# Patient Record
Sex: Female | Born: 1950 | Race: White | Hispanic: No | Smoking: Never smoker
Health system: Southern US, Community
[De-identification: ages and names within clinical notes are randomized; demographics above are authoritative.]

## PROBLEM LIST (undated history)

## (undated) DIAGNOSIS — D649 Anemia, unspecified: Secondary | ICD-10-CM

## (undated) DIAGNOSIS — Z9109 Other allergy status, other than to drugs and biological substances: Secondary | ICD-10-CM

## (undated) DIAGNOSIS — K219 Gastro-esophageal reflux disease without esophagitis: Secondary | ICD-10-CM

## (undated) DIAGNOSIS — I10 Essential (primary) hypertension: Secondary | ICD-10-CM

## (undated) DIAGNOSIS — N6320 Unspecified lump in the left breast, unspecified quadrant: Secondary | ICD-10-CM

## (undated) DIAGNOSIS — F419 Anxiety disorder, unspecified: Secondary | ICD-10-CM

## (undated) DIAGNOSIS — M1990 Unspecified osteoarthritis, unspecified site: Secondary | ICD-10-CM

## (undated) DIAGNOSIS — J45909 Unspecified asthma, uncomplicated: Secondary | ICD-10-CM

## (undated) DIAGNOSIS — E039 Hypothyroidism, unspecified: Secondary | ICD-10-CM

## (undated) HISTORY — PX: COLONOSCOPY W/ POLYPECTOMY: SHX1380

## (undated) HISTORY — PX: SHOULDER ARTHROSCOPY: SHX128

## (undated) HISTORY — PX: LAPAROSCOPY: SHX197

## (undated) HISTORY — PX: VAGINA RECONSTRUCTION SURGERY: SHX828

## (undated) HISTORY — PX: ABDOMINAL HYSTERECTOMY: SHX81

---

## 1998-06-24 ENCOUNTER — Other Ambulatory Visit: Admission: RE | Admit: 1998-06-24 | Discharge: 1998-06-24 | Payer: Self-pay | Admitting: Obstetrics & Gynecology

## 1998-07-17 ENCOUNTER — Other Ambulatory Visit: Admission: RE | Admit: 1998-07-17 | Discharge: 1998-07-17 | Payer: Self-pay | Admitting: Obstetrics & Gynecology

## 1998-10-07 ENCOUNTER — Other Ambulatory Visit: Admission: RE | Admit: 1998-10-07 | Discharge: 1998-10-07 | Payer: Self-pay | Admitting: Obstetrics & Gynecology

## 1999-03-31 ENCOUNTER — Other Ambulatory Visit: Admission: RE | Admit: 1999-03-31 | Discharge: 1999-03-31 | Payer: Self-pay | Admitting: Obstetrics & Gynecology

## 2000-02-03 ENCOUNTER — Other Ambulatory Visit: Admission: RE | Admit: 2000-02-03 | Discharge: 2000-02-03 | Payer: Self-pay | Admitting: Obstetrics & Gynecology

## 2001-04-11 ENCOUNTER — Other Ambulatory Visit: Admission: RE | Admit: 2001-04-11 | Discharge: 2001-04-11 | Payer: Self-pay | Admitting: Obstetrics & Gynecology

## 2002-05-21 ENCOUNTER — Other Ambulatory Visit: Admission: RE | Admit: 2002-05-21 | Discharge: 2002-05-21 | Payer: Self-pay | Admitting: Obstetrics & Gynecology

## 2002-09-20 ENCOUNTER — Emergency Department (HOSPITAL_COMMUNITY): Admission: EM | Admit: 2002-09-20 | Discharge: 2002-09-20 | Payer: Self-pay | Admitting: Emergency Medicine

## 2003-05-27 ENCOUNTER — Other Ambulatory Visit: Admission: RE | Admit: 2003-05-27 | Discharge: 2003-05-27 | Payer: Self-pay | Admitting: Obstetrics & Gynecology

## 2003-06-25 ENCOUNTER — Ambulatory Visit (HOSPITAL_COMMUNITY): Admission: RE | Admit: 2003-06-25 | Discharge: 2003-06-25 | Payer: Self-pay | Admitting: Obstetrics & Gynecology

## 2004-06-15 ENCOUNTER — Emergency Department (HOSPITAL_COMMUNITY): Admission: EM | Admit: 2004-06-15 | Discharge: 2004-06-15 | Payer: Self-pay | Admitting: Emergency Medicine

## 2004-07-03 ENCOUNTER — Other Ambulatory Visit: Admission: RE | Admit: 2004-07-03 | Discharge: 2004-07-03 | Payer: Self-pay | Admitting: Obstetrics & Gynecology

## 2005-09-01 ENCOUNTER — Inpatient Hospital Stay (HOSPITAL_COMMUNITY): Admission: RE | Admit: 2005-09-01 | Discharge: 2005-09-03 | Payer: Self-pay | Admitting: Specialist

## 2012-03-25 ENCOUNTER — Encounter (HOSPITAL_BASED_OUTPATIENT_CLINIC_OR_DEPARTMENT_OTHER): Payer: Self-pay

## 2012-03-25 ENCOUNTER — Emergency Department (HOSPITAL_BASED_OUTPATIENT_CLINIC_OR_DEPARTMENT_OTHER)
Admission: EM | Admit: 2012-03-25 | Discharge: 2012-03-25 | Disposition: A | Discharge status: Home / Self Care | Payer: 59 | Attending: Emergency Medicine | Admitting: Emergency Medicine

## 2012-03-25 DIAGNOSIS — X58XXXA Exposure to other specified factors, initial encounter: Secondary | ICD-10-CM | POA: Insufficient documentation

## 2012-03-25 DIAGNOSIS — S43016A Anterior dislocation of unspecified humerus, initial encounter: Secondary | ICD-10-CM | POA: Insufficient documentation

## 2012-03-25 DIAGNOSIS — Y929 Unspecified place or not applicable: Secondary | ICD-10-CM | POA: Insufficient documentation

## 2012-03-25 DIAGNOSIS — Y939 Activity, unspecified: Secondary | ICD-10-CM | POA: Insufficient documentation

## 2012-03-25 DIAGNOSIS — S43002A Unspecified subluxation of left shoulder joint, initial encounter: Secondary | ICD-10-CM

## 2012-03-25 MED ORDER — HYDROCODONE-ACETAMINOPHEN 5-325 MG PO TABS: 1.0000 | ORAL_TABLET | Freq: Four times a day (QID) | ORAL | Status: DC | PRN

## 2012-03-25 NOTE — ED Provider Notes (Signed)
History     CSN: [id number]  Arrival date & time 03/25/12  6433   First MD Initiated Contact with Patient 03/25/12 [phone]      Chief Complaint  Patient presents with  . Shoulder Pain    (Consider location/radiation/quality/duration/timing/severity/associated sxs/prior treatment) HPI This is a 61 year old female who's had multiple dislocations and subluxations of the left shoulder. She felt it sublux yesterday evening about 11 PM. She is not sure exactly what she did do this. She's been trying overnight to reduce it herself but has not been able to do so. There is only mild to moderate pain associated with it, worse with movement. There is no motor or sensory deficit.  No past medical history on file.  No past surgical history on file.  No family history on file.  History  Substance Use Topics  . Smoking status: Not on file  . Smokeless tobacco: Not on file  . Alcohol Use: Not on file    OB History    No data available      Review of Systems  All other systems reviewed and are negative.    Allergies  Review of patient's allergies indicates not on file.  Home Medications  No current outpatient prescriptions on file.  There were no vitals taken for this visit.  Physical Exam General: Well-developed, well-nourished female in no acute distress; appearance consistent with age of record HENT: normocephalic, atraumatic Eyes: pupils equal round and reactive to light; extraocular muscles intact Neck: supple Heart: regular rate and rhythm Lungs: clear to auscultation bilaterally Abdomen: soft; nondistended; nontender; bowel sounds present Extremities: Mild anterior subluxation of left humeral head with mild tenderness; left upper extremity distally neurovascularly intact Neurologic: Awake, alert and oriented; motor function intact in all extremities and symmetric; no facial droop Skin: Warm and dry Psychiatric: Normal mood and affect    ED Course  Procedures  (including critical care time)  CLOSED REDUCTION The patient's left shoulder subluxation was reduced with hyperextension. There was a return to the patient's subjective normal alignment with full range of motion on objective examination. There were no complications the patient tolerated this well.   MDM  Patient declines x-ray evaluation. She requests a sling.        Hanley Seamen, MD 03/25/12 [phone]

## 2012-03-25 NOTE — ED Notes (Signed)
Shoulder out of sock since 11 am

## 2014-09-30 ENCOUNTER — Other Ambulatory Visit: Payer: Self-pay | Admitting: Obstetrics & Gynecology

## 2014-10-01 LAB — CYTOLOGY - PAP

## 2015-07-22 ENCOUNTER — Other Ambulatory Visit: Payer: Self-pay | Admitting: Obstetrics and Gynecology

## 2015-07-22 DIAGNOSIS — N6320 Unspecified lump in the left breast, unspecified quadrant: Secondary | ICD-10-CM

## 2015-07-24 ENCOUNTER — Ambulatory Visit
Admission: RE | Admit: 2015-07-24 | Discharge: 2015-07-24 | Disposition: A | Discharge status: Home / Self Care | Payer: 59 | Source: Ambulatory Visit | Attending: Obstetrics and Gynecology | Admitting: Obstetrics and Gynecology

## 2015-07-24 DIAGNOSIS — N6320 Unspecified lump in the left breast, unspecified quadrant: Secondary | ICD-10-CM

## 2015-09-09 ENCOUNTER — Other Ambulatory Visit: Payer: Self-pay | Admitting: Surgery

## 2015-09-16 ENCOUNTER — Encounter (HOSPITAL_BASED_OUTPATIENT_CLINIC_OR_DEPARTMENT_OTHER): Payer: Self-pay | Admitting: *Deleted

## 2015-09-18 ENCOUNTER — Encounter (HOSPITAL_BASED_OUTPATIENT_CLINIC_OR_DEPARTMENT_OTHER)
Admission: RE | Admit: 2015-09-18 | Discharge: 2015-09-18 | Disposition: A | Discharge status: Home / Self Care | Payer: 59 | Source: Ambulatory Visit | Attending: Surgery | Admitting: Surgery

## 2015-09-18 ENCOUNTER — Other Ambulatory Visit: Payer: Self-pay

## 2015-09-18 DIAGNOSIS — Z01812 Encounter for preprocedural laboratory examination: Secondary | ICD-10-CM | POA: Diagnosis present

## 2015-09-18 DIAGNOSIS — I10 Essential (primary) hypertension: Secondary | ICD-10-CM | POA: Insufficient documentation

## 2015-09-18 DIAGNOSIS — Z01810 Encounter for preprocedural cardiovascular examination: Secondary | ICD-10-CM | POA: Insufficient documentation

## 2015-09-18 LAB — BASIC METABOLIC PANEL
Anion gap: 8 (ref 5–15)
BUN: 18 mg/dL (ref 6–20)
CALCIUM: 9.6 mg/dL (ref 8.9–10.3)
CO2: 28 mmol/L (ref 22–32)
CREATININE: 0.92 mg/dL (ref 0.44–1.00)
Chloride: 101 mmol/L (ref 101–111)
GFR calc Af Amer: >60 mL/min (ref >60)
GLUCOSE: 90 mg/dL (ref 65–99)
Potassium: 5 mmol/L (ref 3.5–5.1)
SODIUM: 137 mmol/L (ref 135–145)

## 2015-09-18 NOTE — Progress Notes (Signed)
Dr. Al Corpus reviewed EKG - pt needs to see a cardiologist for clearance for surgery. Called Dr. Ree Kida office, spoke with Abigail Butts RN explained pt needs cardiac clearance before she can have surgery here. Spoke with pt no Ekg to compare too and she has never seen a cardiologist.

## 2015-09-23 ENCOUNTER — Ambulatory Visit (HOSPITAL_BASED_OUTPATIENT_CLINIC_OR_DEPARTMENT_OTHER): Admission: RE | Admit: 2015-09-23 | Payer: 59 | Source: Ambulatory Visit | Admitting: Surgery

## 2015-09-23 HISTORY — DX: Unspecified asthma, uncomplicated: J45.909

## 2015-09-23 HISTORY — DX: Unspecified lump in the left breast, unspecified quadrant: N63.20

## 2015-09-23 HISTORY — DX: Essential (primary) hypertension: I10

## 2015-09-23 HISTORY — DX: Unspecified osteoarthritis, unspecified site: M19.90

## 2015-09-23 HISTORY — DX: Anxiety disorder, unspecified: F41.9

## 2015-09-23 HISTORY — DX: Hypothyroidism, unspecified: E03.9

## 2015-09-23 SURGERY — BREAST LUMPECTOMY
Anesthesia: General | Laterality: Left

## 2015-09-30 ENCOUNTER — Telehealth: Payer: Self-pay | Admitting: Cardiovascular Disease

## 2015-09-30 NOTE — Telephone Encounter (Signed)
Received records from [facility] for appointment on 10/07/15 with Dr Gwenlyn Found.  Records given to [facility] (medical records) for Dr Kennon Holter schedule on 10/07/15. lp

## 2015-10-01 ENCOUNTER — Telehealth: Payer: Self-pay | Admitting: Cardiovascular Disease

## 2015-10-01 ENCOUNTER — Encounter: Payer: Self-pay | Admitting: Cardiovascular Disease

## 2015-10-01 NOTE — Telephone Encounter (Signed)
Closed encounter °

## 2015-10-07 ENCOUNTER — Ambulatory Visit: Payer: 59 | Admitting: Cardiovascular Disease

## 2015-10-16 ENCOUNTER — Ambulatory Visit: Payer: 59 | Admitting: Cardiovascular Disease

## 2015-10-21 ENCOUNTER — Ambulatory Visit (INDEPENDENT_AMBULATORY_CARE_PROVIDER_SITE_OTHER): Payer: Managed Care, Other (non HMO) | Admitting: Cardiovascular Disease

## 2015-10-21 ENCOUNTER — Encounter: Payer: Self-pay | Admitting: Cardiovascular Disease

## 2015-10-21 VITALS — BP 106/80 | HR 82 | Ht 65.0 in | Wt 164.1 lb

## 2015-10-21 DIAGNOSIS — R079 Chest pain, unspecified: Secondary | ICD-10-CM | POA: Diagnosis not present

## 2015-10-21 DIAGNOSIS — R9431 Abnormal electrocardiogram [ECG] [EKG]: Secondary | ICD-10-CM

## 2015-10-21 NOTE — Assessment & Plan Note (Signed)
Leslie Hines relates episodes of nocturnal chest pain that awakens her from sleep with some occasional left upper extremity radiation. Because of this, and her upcoming elective lumpectomy, I'm going to arrange for her to undergo exercise Myoview stress testing to risk stratify her.

## 2015-10-21 NOTE — Assessment & Plan Note (Signed)
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 Based on this, in addition to her exercise Myoview stress test we will get a 2-D echo to assess for regional wall motion abnormalities.

## 2015-10-21 NOTE — Patient Instructions (Signed)
Medication Instructions:  Your physician recommends that you continue on your current medications as directed. Please refer to the Current Medication list given to you today.    Testing/Procedures: Your physician has requested that you have an echocardiogram. Echocardiography is a painless test that uses sound waves to create images of your heart. It provides your doctor with information about the size and shape of your heart and how well your heart's chambers and valves are working. This procedure takes approximately one hour. There are no restrictions for this procedure.  Your physician has requested that you have en exercise stress myoview. For further information please visit [email or web address]. Please follow instruction sheet, as given.   Follow-Up: Your physician recommends that you schedule a follow-up appointment ON AN AS NEEDED BASIS IF TEST ARE NORMAL.   Echocardiogram An echocardiogram, or echocardiography, uses sound waves (ultrasound) to produce an image of your heart. The echocardiogram is simple, painless, obtained within a short period of time, and offers valuable information to your health care provider. The images from an echocardiogram can provide information such as:  Evidence of coronary artery disease (CAD).  Heart size.  Heart muscle function.  Heart valve function.  Aneurysm detection.  Evidence of a past heart attack.  Fluid buildup around the heart.  Heart muscle thickening.  Assess heart valve function. LET [facility] CARE PROVIDER KNOW ABOUT:  Any allergies you have.  All medicines you are taking, including vitamins, herbs, eye drops, creams, and over-the-counter medicines.  Previous problems you or members of your family have had with the use of anesthetics.  Any blood disorders you have.  Previous surgeries you have had.  Medical conditions you have.  Possibility of pregnancy, if this applies. BEFORE THE PROCEDURE  No special  preparation is needed. Eat and drink normally.  PROCEDURE   In order to produce an image of your heart, gel will be applied to your chest and a wand-like tool (transducer) will be moved over your chest. The gel will help transmit the sound waves from the transducer. The sound waves will harmlessly bounce off your heart to allow the heart images to be captured in real-time motion. These images will then be recorded.  You may need an IV to receive a medicine that improves the quality of the pictures. AFTER THE PROCEDURE You may return to your normal schedule including diet, activities, and medicines, unless your health care provider tells you otherwise.   This information is not intended to replace advice given to you by your health care provider. Make sure you discuss any questions you have with your health care provider.   Document Released: 04/09/2000 Document Revised: 05/03/2014 Document Reviewed: 12/18/2012 Elsevier Interactive Patient Education [employer].    if you need a refill on your cardiac medications before your next appointment, please call your pharmacy.

## 2015-10-21 NOTE — Progress Notes (Signed)
10/21/2015 Leslie Hines   Oct 11, 1950  [id number]  Primary Physician Gennette Pac, MD Primary Cardiologist: Lorretta Harp MD Renae Gloss  HPI:  Leslie Hines is a delightful 65 year old mildly overweight divorced Caucasian female mother of 2, grandmother of 2 grandchildren who she referred for preoperative clearance before elective lumpectomy scheduled to be performed by Dr. Rush Farmer. Her primary care physician is Dr. Hulan Fess and her endocrinologist Dr. Chalmers Cater . She works as a Scientific laboratory technician. Her risk factors include family history although at an advanced age. She has never had a heart attack or stroke. She has had an uncomplicated left shoulder repair by Dr. Mardelle Matte. Recent lipid profile performed 01/14/15 revealed a total cholesterol of 227, LDL of 102 and HDL of 93. She does relate episodes of nocturnal chest pain that awakened her from sleep several times a year. There is some associated left upper extremity radiation.   Current Outpatient Prescriptions  Medication Sig Dispense Refill  . albuterol (PROVENTIL HFA;VENTOLIN HFA) 108 (90 Base) MCG/ACT inhaler Inhale into the lungs every 6 (six) hours as needed for wheezing or shortness of breath.    . ALPRAZolam (XANAX) 0.5 MG tablet Take 0.5 mg by mouth at bedtime as needed for anxiety.    . calcium carbonate (OS-CAL - DOSED IN MG OF ELEMENTAL CALCIUM) 1250 (500 Ca) MG tablet Take 1 tablet by mouth.    . estradiol (ESTRACE) 2 MG tablet Take 2 mg by mouth daily.    . Fluticasone-Salmeterol (ADVAIR) 100-50 MCG/DOSE AEPB Inhale 1 puff into the lungs 2 (two) times daily.    . hydrochlorothiazide (HYDRODIURIL) 25 MG tablet Take 25 mg by mouth daily.    Marland Kitchen levothyroxine (SYNTHROID, LEVOTHROID) 75 MCG tablet Take 75 mcg by mouth daily before breakfast.    . magnesium 30 MG tablet Take 30 mg by mouth 2 (two) times daily.    . meloxicam (MOBIC) 15 MG tablet Take 15 mg by mouth daily.    . montelukast (SINGULAIR) 10 MG  tablet Take 10 mg by mouth at bedtime.    Merril Abbe 10 MCG TABS vaginal tablet INSERT 1 VAGINALLY TWICE A WEEK  11  . zolpidem (AMBIEN) 10 MG tablet Take 10 mg by mouth at bedtime as needed for sleep.     No current facility-administered medications for this visit.    Allergies  Allergen Reactions  . Macrobid [Nitrofurantoin Macrocrystal] Nausea Only  . Sulfur     Social History   Social History  . Marital Status: Divorced    Spouse Name: N/A  . Number of Children: N/A  . Years of Education: N/A   Occupational History  . Not on file.   Social History Main Topics  . Smoking status: Never Smoker   . Smokeless tobacco: Not on file  . Alcohol Use: Yes     Comment: social  . Drug Use: No  . Sexual Activity: Not on file   Other Topics Concern  . Not on file   Social History Narrative     Review of Systems: General: negative for chills, fever, night sweats or weight changes.  Cardiovascular: negative for chest pain, dyspnea on exertion, edema, orthopnea, palpitations, paroxysmal nocturnal dyspnea or shortness of breath Dermatological: negative for rash Respiratory: negative for cough or wheezing Urologic: negative for hematuria Abdominal: negative for nausea, vomiting, diarrhea, bright red blood per rectum, melena, or hematemesis Neurologic: negative for visual changes, syncope, or dizziness All other systems reviewed and are otherwise  negative except as noted above.    Blood pressure 106/80, pulse 82, height 5\' 5"  (1.651 m), weight 164 lb 2 oz (74.447 kg).  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 I personally reviewed his EKG  ASSESSMENT AND PLAN:   Chest pain [name] relates episodes of nocturnal chest pain that awakens her from sleep with some occasional left upper extremity radiation. Because of this, and her upcoming elective lumpectomy, I'm going to arrange for her to undergo exercise Myoview stress testing to risk stratify her.  Abnormal EKG 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 Based on this, in addition to her exercise Myoview stress test we will get a 2-D echo to assess for regional wall motion abnormalities.      Lorretta Harp MD FACP,FACC,FAHA, [facility] 10/21/2015 9:40 AM

## 2015-10-22 ENCOUNTER — Telehealth (HOSPITAL_COMMUNITY): Payer: Self-pay

## 2015-10-22 NOTE — Telephone Encounter (Signed)
Encounter complete. 

## 2015-10-24 ENCOUNTER — Ambulatory Visit (HOSPITAL_COMMUNITY)
Admission: RE | Admit: 2015-10-24 | Discharge: 2015-10-24 | Disposition: A | Discharge status: Home / Self Care | Payer: Managed Care, Other (non HMO) | Source: Ambulatory Visit | Attending: Internal Medicine | Admitting: Internal Medicine

## 2015-10-24 DIAGNOSIS — R9431 Abnormal electrocardiogram [ECG] [EKG]: Secondary | ICD-10-CM | POA: Insufficient documentation

## 2015-10-24 DIAGNOSIS — R079 Chest pain, unspecified: Secondary | ICD-10-CM | POA: Insufficient documentation

## 2015-10-24 DIAGNOSIS — Z8249 Family history of ischemic heart disease and other diseases of the circulatory system: Secondary | ICD-10-CM | POA: Insufficient documentation

## 2015-10-24 LAB — MYOCARDIAL PERFUSION IMAGING
CHL CUP NUCLEAR SDS: 2
CHL CUP RESTING HR STRESS: 75 {beats}/min
CHL RATE OF PERCEIVED EXERTION: 16
CSEPEDS: 35 s
CSEPEW: 9.4 METS
Exercise duration (min): 7 min
LVDIAVOL: 56 mL (ref 46–106)
LVSYSVOL: 21 mL
MPHR: 158 {beats}/min
Peak HR: 157 {beats}/min
Percent HR: 99 %
SRS: 5
SSS: 7

## 2015-10-24 MED ORDER — TECHNETIUM TC 99M TETROFOSMIN IV KIT
31.5000 | PACK | Freq: Once | INTRAVENOUS | Status: AC | PRN
  Administered 2015-10-24: 32 via INTRAVENOUS
  Filled 2015-10-24: qty 32

## 2015-10-24 MED ORDER — TECHNETIUM TC 99M TETROFOSMIN IV KIT
10.6000 | PACK | Freq: Once | INTRAVENOUS | Status: AC | PRN
  Administered 2015-10-24: 11 via INTRAVENOUS
  Filled 2015-10-24: qty 11

## 2015-11-07 ENCOUNTER — Ambulatory Visit (HOSPITAL_COMMUNITY): Payer: Managed Care, Other (non HMO) | Attending: Cardiology

## 2015-11-07 ENCOUNTER — Other Ambulatory Visit: Payer: Self-pay

## 2015-11-07 DIAGNOSIS — I340 Nonrheumatic mitral (valve) insufficiency: Secondary | ICD-10-CM | POA: Insufficient documentation

## 2015-11-07 DIAGNOSIS — I071 Rheumatic tricuspid insufficiency: Secondary | ICD-10-CM | POA: Insufficient documentation

## 2015-11-07 DIAGNOSIS — I517 Cardiomegaly: Secondary | ICD-10-CM | POA: Insufficient documentation

## 2015-11-07 DIAGNOSIS — R9431 Abnormal electrocardiogram [ECG] [EKG]: Secondary | ICD-10-CM | POA: Insufficient documentation

## 2015-11-07 DIAGNOSIS — R079 Chest pain, unspecified: Secondary | ICD-10-CM | POA: Insufficient documentation

## 2015-11-14 ENCOUNTER — Telehealth: Payer: Self-pay | Admitting: Cardiovascular Disease

## 2015-11-14 NOTE — Telephone Encounter (Signed)
New message     Patient calling for 7/14 echo test results.    Patient also checking on cardiac clearance   Asking for a call back today   Patient women physical on Tuesday blood pressure was high.   Pt c/o medication issue:  1. Name of Medication: HCTZ  2. How are you currently taking this medication (dosage and times per day)? 25 MG   3. Are you having a reaction (difficulty breathing--STAT)? No   4. What is your medication issue? Dr. Nori Riis discontinue medication -

## 2015-11-14 NOTE — Telephone Encounter (Signed)
Spoke with pt, she is needing clearance for breast lumpectomy. The procedure is currently not scheduled they are waiting for clearance. Pt also reports for the last 8 weeks her bp is running 125-135/90's. She is still taking the HCTZ. Her bp at the GYN recently was 140/110. Will forward for dr berry's review and advise

## 2015-11-14 NOTE — Telephone Encounter (Signed)
2-D echo MIP were completely normal. She is cleared for her upcoming lobectomy.

## 2015-11-14 NOTE — Telephone Encounter (Signed)
Clearance for her lumpectomy will depend on results of her Myoview and echo which have been ordered.

## 2015-11-15 NOTE — Telephone Encounter (Signed)
Can start on lisinipril 5 mg daily and follow up with Kristen 2-3 weeks

## 2015-11-18 ENCOUNTER — Telehealth: Payer: Self-pay | Admitting: Cardiovascular Disease

## 2015-11-18 MED ORDER — LISINOPRIL 5 MG PO TABS: 5.0000 mg | ORAL_TABLET | Freq: Every day | ORAL | 2 refills | Status: DC

## 2015-11-18 NOTE — Telephone Encounter (Signed)
New Message   Pt calling wanting update on surgical clearance-requested call back today. wy

## 2015-11-18 NOTE — Telephone Encounter (Signed)
Returned call to patient. Advised that she has been cleared for lumpectomy by Dr. Gwenlyn Found. Reviewed echo and stress test findings in detail w/ patient. She voiced understanding. Regarding BP med recommendation, lisinopril 5mg  daily called to pharmacy.  Pt given instructions on taking this medication and to continue other meds as currently prescribed. Offered to schedule BP visit, pt states mid-august busy due to appts and pending surgery - she would like to call back to arrange BP recheck when she knows her availability.  Pt voiced understanding of instructions and is aware to call when she knows her availability for BP check in office.

## 2015-11-19 ENCOUNTER — Encounter: Payer: Self-pay | Admitting: *Deleted

## 2015-11-19 NOTE — Telephone Encounter (Deleted)
Called Dr Trevor Mace office to get fax number for clearance. Fax number is [phone], attention [place].

## 2015-11-19 NOTE — Telephone Encounter (Signed)
Clearance faxed to Dr Trevor Mace office via [facility] in another telephone encounter.

## 2015-11-19 NOTE — Telephone Encounter (Signed)
Called Dr Trevor Mace office to get fax number for clearance. Fax number is [phone], to Attention [place].

## 2015-11-19 NOTE — Telephone Encounter (Signed)
Message routed from [place] to number provided.

## 2015-11-19 NOTE — Telephone Encounter (Signed)
Leslie Demark, RN      11/18/15 11:19 AM  Note    Returned call to patient. Advised that she has been cleared for lumpectomy by Dr. Gwenlyn Found. Reviewed echo and stress test findings in detail w/ patient. She voiced understanding. Regarding BP med recommendation, lisinopril 5mg  daily called to pharmacy.  Pt given instructions on taking this medication and to continue other meds as currently prescribed. Offered to schedule BP visit, pt states mid-august busy due to appts and pending surgery - she would like to call back to arrange BP recheck when she knows her availability.  Pt voiced understanding of instructions and is aware to call when she knows her availability for BP check in office.

## 2015-12-19 ENCOUNTER — Telehealth: Payer: Self-pay | Admitting: Cardiovascular Disease

## 2015-12-19 NOTE — Telephone Encounter (Signed)
Reviewed notes. Pt had been started on lisinopril 5mg  daily 1 month ago after succession of elevated BP readings.  Called to inquire about symptoms, no answer when dialed. LMTCB.

## 2015-12-19 NOTE — Telephone Encounter (Signed)
New Message  Pt c/o medication issue:  1. Name of Medication: Losinopril  2. How are you currently taking this medication (dosage and times per day)? 5mg    3. Are you having a reaction (difficulty breathing--STAT)? Pt states passing out   4. What is your medication issue? Pt states she has been passing out since starting med. Pt states she does not know if it is because of the med, but she would like to speak with a Rn to discuss. Please call back to advices.

## 2015-12-22 NOTE — Telephone Encounter (Signed)
Returned patient call-pt reports having 3-4 episodes of near syncopal episodes in the last 1-2 weeks, denies LOC.  Reports that she becomes lightheaded, dizzy and has to sit down to keep from passing out.  Denies these episodes occurring with change in positions.  Reports these episodes occur at different times, last episode was 8/24.  Pt reports starting Lisinopril 5mg  ~ 1 month ago and has no other recent medication changes.  Reports she bought a BP machine but it did not work and had to take it back, bought a wrist BP machine and it has been giving her very "irradic" readings.  Pt reports feeling a little lightheaded today.  Advised to make slow movements with change of positions.    Advised we will make her an appt with clinical pharmacist for BP check as recommended in July after medication change but was never completed.  Will send to scheduling for appt to be made.  Pt verbalized understanding.

## 2015-12-22 NOTE — Telephone Encounter (Signed)
Pt returning Nathan's call.

## 2015-12-22 NOTE — Telephone Encounter (Signed)
Pt made aware of BP check appt with Leslie Hines 8/29 @ 830 at [facility] location.  Pt verbalized understanding.

## 2015-12-23 ENCOUNTER — Encounter: Payer: Self-pay | Admitting: Pharmacist

## 2015-12-23 ENCOUNTER — Ambulatory Visit (INDEPENDENT_AMBULATORY_CARE_PROVIDER_SITE_OTHER): Payer: Managed Care, Other (non HMO) | Admitting: Pharmacist

## 2015-12-23 VITALS — BP 115/78 | Ht 65.0 in | Wt 160.0 lb

## 2015-12-23 DIAGNOSIS — I10 Essential (primary) hypertension: Secondary | ICD-10-CM | POA: Insufficient documentation

## 2015-12-23 MED ORDER — HYDROCHLOROTHIAZIDE 25 MG PO TABS: 25.0000 mg | ORAL_TABLET | Freq: Every day | ORAL | 3 refills | Status: DC

## 2015-12-23 NOTE — Progress Notes (Signed)
Patient ID: Leslie Hines                 DOB: 01/12/1951                      MRN: [id number]     HPI: Leslie Hines is a 65 y.o. female patient of Dr. Gwenlyn Found with [facility] who presents today for hypertension evaluation. She was referred to Dr. Gwenlyn Found recently for cardiac clearance for breast lumpectomy. She recently had an echo which returned normal with EF 65-70%.   She has recently been experiencing dizzy spells one of which she has even passed out and fell to the ground. She started lisinopril about 1 month ago and states that these spells are new since starting this med. She has been on HCTZ for several years and done well with this medication.   Cardiac Hx: nocturnal chest pain  Current HTN meds:  Hydrochlorothiazide 25mg  daily Lisinopril 5mg  daily  BP goal: <140/90  Family History: Mother has hypertension, but is still living at 71 yo and father has had heart issues since 63 yo but is also still living at 63 yo. She has one brother for whom she is unsure his cardiac history.   Social History: Denies tobacco. Drinks 1-2 glasses of wine per evening.   Diet: She eats most of her meals from home. She does not add salt to her food but acknowledges that there is probably sodium in a decent amount of the sauces that she uses. She drinks 1 cup of coffee per day and tea 3-4 times a week.   Exercise: She is limited after a should replacement surgery. She does walk or swim 4 times a week for up to 41minutes. She admits she has been lax with this the last few weeks due to the dizzy spells.   Home BP readings:  She recently purchased a home wrist cuff.  She occasionally sees measurements in the 130s and once in the 160s, but otherwise her pressure runs mostly in the 110s/60-70s.   Wt Readings from Last 3 Encounters:  10/24/15 164 lb (74.4 kg)  10/21/15 164 lb 2 oz (74.4 kg)   BP Readings from Last 3 Encounters:  10/21/15 106/80  03/25/12 144/95   Pulse Readings from Last 3 Encounters:    10/21/15 82  03/25/12 96    Renal function: CrCl cannot be calculated (Unknown ideal weight.).  Past Medical History:  Diagnosis Date  . Anxiety   . Arthritis    lt shoulder  . Asthma    allergy related  . Breast mass, left   . Hypertension   . Hypothyroidism     Current Outpatient Prescriptions on File Prior to Visit  Medication Sig Dispense Refill  . albuterol (PROVENTIL HFA;VENTOLIN HFA) 108 (90 Base) MCG/ACT inhaler Inhale 1-2 puffs into the lungs every 6 (six) hours as needed for wheezing or shortness of breath.     . ALPRAZolam (XANAX) 0.5 MG tablet Take 0.5 mg by mouth daily as needed for anxiety.     Marland Kitchen estradiol (ESTRACE) 2 MG tablet Take 2 mg by mouth daily.    . Fluticasone-Salmeterol (ADVAIR) 250-50 MCG/DOSE AEPB Inhale 1 puff into the lungs 2 (two) times daily as needed (shortness of breath).    . hydrochlorothiazide (HYDRODIURIL) 25 MG tablet Take 25 mg by mouth daily.    Marland Kitchen levothyroxine (SYNTHROID, LEVOTHROID) 75 MCG tablet Take 75 mcg by mouth daily before breakfast.    .  lisinopril (PRINIVIL,ZESTRIL) 5 MG tablet Take 1 tablet (5 mg total) by mouth daily. 90 tablet 2  . magnesium 30 MG tablet Take 30 mg by mouth daily.     . montelukast (SINGULAIR) 10 MG tablet Take 10 mg by mouth daily.     Marland Kitchen POTASSIUM PO Take 1 tablet by mouth daily.    Marland Kitchen zolpidem (AMBIEN CR) 12.5 MG CR tablet Take 12.5 mg by mouth at bedtime.  4   No current facility-administered medications on file prior to visit.     Allergies  Allergen Reactions  . Macrobid [Nitrofurantoin Macrocrystal] Nausea Only  . Sulfur Hives    There were no vitals taken for this visit.   Assessment/Plan: Hypertension: BP is below goal. Since she has been having symptoms of low blood pressure and has even fainted about a week ago will have her discontinue lisinopril. She will remain on HCTZ. She will follow up with hypertension clinic as needed if pressures trend up or other issues arise.    Thank  you, Lelan Pons. Patterson Hammersmith, [facility]  12/23/2015 8:07 AM

## 2015-12-23 NOTE — Patient Instructions (Signed)
Check your blood pressure at home daily (if able) and keep record of the readings.  Take your BP meds as follows: Stop taking Lisinopril  Continue taking hydrochlorothiazide as prescribed.   Bring all of your meds, your BP cuff and your record of home blood pressures to your next appointment.  Exercise as you're able, try to walk approximately 30 minutes per day.  Keep salt intake to a minimum, especially watch canned and prepared boxed foods.  Eat more fresh fruits and vegetables and fewer canned items.  Avoid eating in fast food restaurants.    HOW TO TAKE YOUR BLOOD PRESSURE: . Rest 5 minutes before taking your blood pressure. .  Don't smoke or drink caffeinated beverages for at least 30 minutes before. . Take your blood pressure before (not after) you eat. . Sit comfortably with your back supported and both feet on the floor (don't cross your legs). . Elevate your arm to heart level on a table or a desk. . Use the proper sized cuff. It should fit smoothly and snugly around your bare upper arm. There should be enough room to slip a fingertip under the cuff. The bottom edge of the cuff should be 1 inch above the crease of the elbow. . Ideally, take 3 measurements at one sitting and record the average.

## 2015-12-24 ENCOUNTER — Encounter (HOSPITAL_COMMUNITY)
Admission: RE | Admit: 2015-12-24 | Discharge: 2015-12-24 | Disposition: A | Discharge status: Home / Self Care | Payer: Managed Care, Other (non HMO) | Source: Ambulatory Visit | Attending: Surgery | Admitting: Surgery

## 2015-12-24 ENCOUNTER — Encounter (HOSPITAL_COMMUNITY): Payer: Self-pay

## 2015-12-24 DIAGNOSIS — Z01818 Encounter for other preprocedural examination: Secondary | ICD-10-CM | POA: Diagnosis not present

## 2015-12-24 HISTORY — DX: Other allergy status, other than to drugs and biological substances: Z91.09

## 2015-12-24 LAB — CBC
HCT: 39.9 % (ref 36.0–46.0)
HEMOGLOBIN: 13.1 g/dL (ref 12.0–15.0)
MCH: 28.5 pg (ref 26.0–34.0)
MCHC: 32.8 g/dL (ref 30.0–36.0)
MCV: 86.9 fL (ref 78.0–100.0)
Platelets: 338 10*3/uL (ref 150–400)
RBC: 4.59 MIL/uL (ref 3.87–5.11)
RDW: 13.4 % (ref 11.5–15.5)
WBC: 7.8 10*3/uL (ref 4.0–10.5)

## 2015-12-24 LAB — BASIC METABOLIC PANEL
ANION GAP: 8 (ref 5–15)
BUN: 12 mg/dL (ref 6–20)
CHLORIDE: 103 mmol/L (ref 101–111)
CO2: 25 mmol/L (ref 22–32)
Calcium: 9 mg/dL (ref 8.9–10.3)
Creatinine, Ser: 0.9 mg/dL (ref 0.44–1.00)
GFR calc Af Amer: >60 mL/min (ref >60)
GFR calc non Af Amer: >60 mL/min (ref >60)
GLUCOSE: 81 mg/dL (ref 65–99)
POTASSIUM: 3.8 mmol/L (ref 3.5–5.1)
SODIUM: 136 mmol/L (ref 135–145)

## 2015-12-24 NOTE — Progress Notes (Signed)
PCP is Hulan Fess  Cardiologist is Quay Burow. LOV for cardiac clearance was on 12/23/15.  Patient denied having any acute cardiac or pulmonary issues, and informed Nurse that Albuterol inhaler was last used in February 2017.  Nurse inquired about Lisinopril, and patient stated it was discontinued yesterday because it caused her to have "dizzy spells and nausea." Patient stated "I last took it on Monday." Patient denied having any dizziness or nausea at this time.   Will send chart to Anesthesia for review.

## 2015-12-24 NOTE — Pre-Procedure Instructions (Signed)
Leslie Hines  12/24/2015     Your procedure is scheduled on : Wednesday December 31, 2015 at 10:00 AM.  Report to [facility] Admitting at 8:00 AM.  Call this number if you have problems the morning of surgery: [phone]    Remember:  Do not eat food or drink liquids after midnight.  Take these medicines the morning of surgery with A SIP OF WATER : Albuterol inhaler if needed (bring inhaler with you), Alprazolam (Xanax) if needed, Advair inhaler, Levothyroxine (Synthroid), Montelukast (Singulair)   Stop taking any vitamin, herbal medications/supplements, NSAIDs, Ibuprofen, Advil, Motrin, Aleve, etc today   Do not wear jewelry, make-up or nail polish.  Do not wear lotions, powders, or perfumes, or deoderant.  Do not shave 48 hours prior to surgery.   Do not bring valuables to the hospital.  [facility] is not responsible for any belongings or valuables.  Contacts, dentures or bridgework may not be worn into surgery.  Leave your suitcase in the car.  After surgery it may be brought to your room.  For patients admitted to the hospital, discharge time will be determined by your treatment team.  Patients discharged the day of surgery will not be allowed to drive home.   Name and phone number of your driver:    Special instructions:  Shower using CHG soap the night before and the morning of your surgery  Please read over the following fact sheets that you were given. Pain Booklet and MRSA Information

## 2015-12-25 NOTE — Progress Notes (Signed)
Anesthesia Chart Review:  Pt is a 65 year old female scheduled for L breast lumpectomy on 12/31/2015 with Coralie Keens, MD  - PCP is Hulan Fess, MD.  -Cardiologist is Quay Burow, MD who has cleared pt for surgery.   PMH includes:  HTN, asthma, hypothyroidism. Never smoker. BMI 26.5  Medications include: albuterol, advair, hctz, levothyroxine, lisinopril, potassium  Preoperative labs reviewed.    EKG 10/21/15: NSR. Low voltage QRS. LAFB. Possible anterolateral infarct, age undetermined.   Echo 11/07/15:  - Left ventricle: The cavity size was normal. There was mild concentric hypertrophy. Systolic function was vigorous. The estimated ejection fraction was in the range of 65% to 70%. Wall motion was normal; there were no regional wall motion abnormalities. Doppler parameters are consistent with abnormal left ventricular relaxation (grade 1 diastolic dysfunction). Doppler parameters are consistent with intermediate ventricular end-diastolic filling pressure. - Mitral valve: Structurally normal valve. There was mild regurgitation. - Tricuspid valve: There was mild regurgitation.  Nuclear stress test 10/24/15:   The left ventricular ejection fraction is normal (55-65%).  Nuclear stress EF: 63%.  There was no ST segment deviation noted during stress.  The study is normal.  This is a low risk study.  If no changes, I anticipate pt can proceed with surgery as scheduled.   Willeen Cass, FNP-BC [facility]/Anesthesiology Phone: [phone] 12/25/2015 1:39 PM

## 2015-12-26 HISTORY — PX: BREAST EXCISIONAL BIOPSY: SUR124

## 2015-12-30 NOTE — H&P (Signed)
Leslie Hines. Leslie Hines  Location: [facility] Patient #: [id number] DOB: [birth year] Single / Language: Leslie Hines / Race: White Female   History of Present Illness  The patient is a 65 year old female who presents with a breast mass. This is a pleasant patient referred by Leslie Chance NP for a left breast mass. The patient only recently noticed a large breast mass in the Tail of Spence of the left breast. She has recently recovered from left shoulder arthroscopy. She has had no previous problems regarding her breasts. She has no pain with the mass and denies trauma. There is not a history of breast cancer. She denies nipple discharge. She is otherwise without complaints.   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  Diagnostic Studies History Leslie Hines, CMA;  Colonoscopy 1-5 years ago Mammogram within last year Pap Smear 1-5 years ago  Allergies (Leslie Hines, CMA;  Sulfa Antibiotics Nitrofurantoin *URINARY ANTI-INFECTIVES*  Medication History (Leslie Hines, CMA; ALPRAZolam (0.5MG  Tablet, Oral as needed) Active. Advair Diskus (250-50MCG/DOSE Aero Pow Br Act, Inhalation) Active. Levothyroxine Sodium (75MCG Tablet, Oral) Active. HydroCHLOROthiazide (25MG  Tablet, Oral) Active. Montelukast Sodium (10MG  Tablet, Oral) Active. Ventolin HFA (108 (90 Base)MCG/ACT Aerosol Soln, Inhalation) Active. Vagifem (10MCG Tablet, Vaginal) Active. Estradiol (2MG  Tablet, Oral) Active. Zolpidem Tartrate (10MG  Tablet, Oral) Active. Oxycodone-Acetaminophen (10-325MG  Tablet, Oral as needed) Active. Medications Reconciled  Social History Alcohol use Moderate alcohol use. Caffeine use Coffee, Tea. Illicit drug use Remotely quit drug use. Tobacco use Never smoker.  Family History  Arthritis  Mother. Bleeding disorder Father. Colon Polyps Father. Heart Disease Father. Kidney Disease Father. Thyroid problems Father.  Pregnancy / Birth History Leslie Hines, Leslie Hines;  Age at menarche 52 years. Age of menopause 31-50 Contraceptive History Intrauterine device, Oral contraceptives. Gravida 2 Maternal age 17-30 Para 2    Review of Systems  General Not Present- Appetite Loss, Chills, Fatigue, Fever, Night Sweats, Weight Gain and Weight Loss. Skin Not Present- Change in Wart/Mole, Dryness, Hives, Jaundice, New Lesions, Non-Healing Wounds, Rash and Ulcer. HEENT Present- Wears glasses/contact lenses. Not Present- Earache, Hearing Loss, Hoarseness, Nose Bleed, Oral Ulcers, Ringing in the Ears, Seasonal Allergies, Sinus Pain, Sore Throat, Visual Disturbances and Yellow Eyes. Respiratory Present- Wheezing. Not Present- Bloody sputum, Chronic Cough, Difficulty Breathing and Snoring. Breast Present- Breast Mass and Skin Changes. Not Present- Breast Pain and Nipple Discharge. Cardiovascular Not Present- Chest Pain, Difficulty Breathing Lying Down, Leg Cramps, Palpitations, Rapid Heart Rate, Shortness of Breath and Swelling of Extremities. Gastrointestinal Not Present- Abdominal Pain, Bloating, Bloody Stool, Change in Bowel Habits, Chronic diarrhea, Constipation, Difficulty Swallowing, Excessive gas, Gets full quickly at meals, Hemorrhoids, Indigestion, Nausea, Rectal Pain and Vomiting. Female Genitourinary Not Present- Frequency, Nocturia, Painful Urination, Pelvic Pain and Urgency. Musculoskeletal Present- Joint Pain. Not Present- Back Pain, Joint Stiffness, Muscle Pain, Muscle Weakness and Swelling of Extremities. Neurological Present- Numbness. Not Present- Decreased Memory, Fainting, Headaches, Seizures, Tingling, Tremor, Trouble walking and Weakness. Psychiatric Present- Anxiety. Not Present- Bipolar, Change in Sleep Pattern, Depression, Fearful and Frequent crying. Endocrine Not  Present- Cold Intolerance, Excessive Hunger, Hair Changes, Heat Intolerance, Hot flashes and New Diabetes. Hematology Not Present- Easy Bruising, Excessive bleeding, Gland problems, HIV and Persistent Infections.  Vitals   Height: 54in Temp.: 15F(Temporal)  Pulse: 76 (Regular)  BP: 126/80 (Sitting, Left Arm, Standard)   Physical Exam  General Mental  Status-Alert. General Appearance-Consistent with stated age. Hydration-Well hydrated. Voice-Normal.  Head and Neck Head-normocephalic, atraumatic with no lesions or palpable masses. Trachea-midline. Thyroid Gland Characteristics - normal size and consistency.  Eye Eyeball - Bilateral-Extraocular movements intact. Sclera/Conjunctiva - Bilateral-No scleral icterus.  Chest and Lung Exam Chest and lung exam reveals -quiet, even and easy respiratory effort with no use of accessory muscles and on auscultation, normal breath sounds, no adventitious sounds and normal vocal resonance. Inspection Chest Wall - Normal. Back - normal.  Breast Breast - Left-Non Tender, No Biopsy scars, no Dimpling, No Inflammation, No Lumpectomy scars, No Mastectomy scars, No Peau d' Orange. Note: There is a large, 6 cm, visible mass in the upper outer quadrant of the left breast. It is soft and mobile. There is no erythema or skin changes. There is no left axillary adenopathy Breast - Right-Symmetric, Non Tender, No Biopsy scars, no Dimpling, No Inflammation, No Lumpectomy scars, No Mastectomy scars, No Peau d' Orange. Breast Lump Left Tail - Size - Note: 6 cm.  Cardiovascular Cardiovascular examination reveals -normal heart sounds, regular rate and rhythm with no murmurs and normal pedal pulses bilaterally.  Abdomen Inspection Inspection of the abdomen reveals - No Hernias. Skin - Scar - no surgical scars. Palpation/Percussion Palpation and Percussion of the abdomen reveal - Soft, Non Tender, No Rebound tenderness, No  Rigidity (guarding) and No hepatosplenomegaly. Auscultation Auscultation of the abdomen reveals - Bowel sounds normal.  Neurologic Neurologic evaluation reveals -alert and oriented x 3 with no impairment of recent or remote memory. Mental Status-Normal.  Musculoskeletal Normal Exam - Left-Upper Extremity Strength Normal and Lower Extremity Strength Normal. Normal Exam - Right-Upper Extremity Strength Normal and Lower Extremity Strength Normal.  Lymphatic Head & Neck  General Head & Neck Lymphatics: Bilateral - Description - Normal. Axillary  General Axillary Region: Bilateral - Description - Normal. Tenderness - Non Tender. Femoral & Inguinal  Generalized Femoral & Inguinal Lymphatics: Bilateral - Description - Normal. Tenderness - Non Tender.    Assessment & Plan (Lorrena Goranson A. Ninfa Linden MD;   LEFT BREAST MASS (N63)  Impression: This is a significant mass on physical examination. Mammogram and ultrasound are inconclusive. Left breast lumpectomy is recommended for histologic evaluation to rule out malignancy. I discussed this with her in detail. I discussed the risks of surgery which includes but is not limited to bleeding, infection, seroma formation, need for further surgery if malignancy is present, postoperative recovery, etc. She understands and wished to proceed with surgery which will be scheduled.

## 2015-12-31 ENCOUNTER — Other Ambulatory Visit: Payer: Self-pay | Admitting: Surgery

## 2015-12-31 ENCOUNTER — Ambulatory Visit (HOSPITAL_COMMUNITY)
Admission: RE | Admit: 2015-12-31 | Discharge: 2015-12-31 | Disposition: A | Discharge status: Home / Self Care | Payer: Managed Care, Other (non HMO) | Source: Ambulatory Visit | Attending: Surgery | Admitting: Surgery

## 2015-12-31 ENCOUNTER — Ambulatory Visit (HOSPITAL_COMMUNITY): Payer: Managed Care, Other (non HMO) | Admitting: Certified Registered Nurse Anesthetist

## 2015-12-31 ENCOUNTER — Encounter (HOSPITAL_COMMUNITY): Payer: Self-pay | Admitting: *Deleted

## 2015-12-31 ENCOUNTER — Encounter (HOSPITAL_COMMUNITY)
Admission: RE | Disposition: A | Discharge status: Home / Self Care | Payer: Self-pay | Source: Ambulatory Visit | Attending: Surgery

## 2015-12-31 ENCOUNTER — Ambulatory Visit (HOSPITAL_COMMUNITY): Payer: Managed Care, Other (non HMO) | Admitting: Emergency Medicine

## 2015-12-31 DIAGNOSIS — R222 Localized swelling, mass and lump, trunk: Secondary | ICD-10-CM | POA: Insufficient documentation

## 2015-12-31 DIAGNOSIS — F419 Anxiety disorder, unspecified: Secondary | ICD-10-CM | POA: Insufficient documentation

## 2015-12-31 DIAGNOSIS — I10 Essential (primary) hypertension: Secondary | ICD-10-CM | POA: Insufficient documentation

## 2015-12-31 DIAGNOSIS — Z79899 Other long term (current) drug therapy: Secondary | ICD-10-CM | POA: Diagnosis not present

## 2015-12-31 DIAGNOSIS — E039 Hypothyroidism, unspecified: Secondary | ICD-10-CM | POA: Insufficient documentation

## 2015-12-31 DIAGNOSIS — J45909 Unspecified asthma, uncomplicated: Secondary | ICD-10-CM | POA: Insufficient documentation

## 2015-12-31 DIAGNOSIS — Z79890 Hormone replacement therapy (postmenopausal): Secondary | ICD-10-CM | POA: Diagnosis not present

## 2015-12-31 HISTORY — PX: BREAST LUMPECTOMY: SHX2

## 2015-12-31 SURGERY — BREAST LUMPECTOMY
Anesthesia: General | Site: Breast | Laterality: Left

## 2015-12-31 MED ORDER — LIDOCAINE HCL (CARDIAC) 20 MG/ML IV SOLN
INTRAVENOUS | Status: DC | PRN
  Administered 2015-12-31: 60 mg via INTRAVENOUS

## 2015-12-31 MED ORDER — MIDAZOLAM HCL 2 MG/2ML IJ SOLN
INTRAMUSCULAR | Status: AC
  Filled 2015-12-31: qty 2

## 2015-12-31 MED ORDER — BUPIVACAINE-EPINEPHRINE (PF) 0.25% -1:200000 IJ SOLN
INTRAMUSCULAR | Status: AC
  Filled 2015-12-31: qty 30

## 2015-12-31 MED ORDER — OXYCODONE HCL 5 MG PO TABS
ORAL_TABLET | ORAL | Status: AC
  Filled 2015-12-31: qty 2

## 2015-12-31 MED ORDER — OXYCODONE-ACETAMINOPHEN 5-325 MG PO TABS: 1.0000 | ORAL_TABLET | ORAL | 0 refills | Status: DC | PRN

## 2015-12-31 MED ORDER — HYDROMORPHONE HCL 1 MG/ML IJ SOLN
INTRAMUSCULAR | Status: AC
  Filled 2015-12-31: qty 1

## 2015-12-31 MED ORDER — 0.9 % SODIUM CHLORIDE (POUR BTL) OPTIME
TOPICAL | Status: DC | PRN
  Administered 2015-12-31: 1000 mL

## 2015-12-31 MED ORDER — MIDAZOLAM HCL 2 MG/2ML IJ SOLN
INTRAMUSCULAR | Status: DC | PRN
  Administered 2015-12-31: 2 mg via INTRAVENOUS

## 2015-12-31 MED ORDER — LACTATED RINGERS IV SOLN
INTRAVENOUS | Status: DC
  Administered 2015-12-31: 08:00:00 via INTRAVENOUS

## 2015-12-31 MED ORDER — BUPIVACAINE-EPINEPHRINE 0.25% -1:200000 IJ SOLN
INTRAMUSCULAR | Status: DC | PRN
  Administered 2015-12-31: 17 mL

## 2015-12-31 MED ORDER — HYDROMORPHONE HCL 1 MG/ML IJ SOLN
0.2500 mg | INTRAMUSCULAR | Status: DC | PRN
  Administered 2015-12-31 (×2): 0.5 mg via INTRAVENOUS

## 2015-12-31 MED ORDER — FENTANYL CITRATE (PF) 100 MCG/2ML IJ SOLN
INTRAMUSCULAR | Status: AC
  Filled 2015-12-31: qty 2

## 2015-12-31 MED ORDER — LIDOCAINE 2% (20 MG/ML) 5 ML SYRINGE
INTRAMUSCULAR | Status: AC
  Filled 2015-12-31: qty 5

## 2015-12-31 MED ORDER — FENTANYL CITRATE (PF) 100 MCG/2ML IJ SOLN
INTRAMUSCULAR | Status: DC | PRN
  Administered 2015-12-31 (×4): 25 ug via INTRAVENOUS

## 2015-12-31 MED ORDER — CEFAZOLIN SODIUM-DEXTROSE 2-3 GM-% IV SOLR
INTRAVENOUS | Status: DC | PRN
  Administered 2015-12-31: 2 g via INTRAVENOUS

## 2015-12-31 MED ORDER — DEXAMETHASONE SODIUM PHOSPHATE 10 MG/ML IJ SOLN
INTRAMUSCULAR | Status: AC
  Filled 2015-12-31: qty 1

## 2015-12-31 MED ORDER — ONDANSETRON HCL 4 MG/2ML IJ SOLN
INTRAMUSCULAR | Status: AC
  Filled 2015-12-31: qty 2

## 2015-12-31 MED ORDER — DEXAMETHASONE SODIUM PHOSPHATE 10 MG/ML IJ SOLN
INTRAMUSCULAR | Status: DC | PRN
  Administered 2015-12-31: 5 mg via INTRAVENOUS

## 2015-12-31 MED ORDER — OXYCODONE HCL 5 MG PO TABS
5.0000 mg | ORAL_TABLET | ORAL | Status: DC | PRN
  Administered 2015-12-31: 10 mg via ORAL

## 2015-12-31 MED ORDER — ONDANSETRON HCL 4 MG/2ML IJ SOLN
INTRAMUSCULAR | Status: DC | PRN
  Administered 2015-12-31: 4 mg via INTRAVENOUS

## 2015-12-31 MED ORDER — PROPOFOL 10 MG/ML IV BOLUS
INTRAVENOUS | Status: DC | PRN
  Administered 2015-12-31: 10 mg via INTRAVENOUS
  Administered 2015-12-31: 150 mg via INTRAVENOUS

## 2015-12-31 SURGICAL SUPPLY — 48 items
BINDER BREAST LRG (GAUZE/BANDAGES/DRESSINGS) IMPLANT
BINDER BREAST XLRG (GAUZE/BANDAGES/DRESSINGS) IMPLANT
CANISTER SUCTION 2500CC (MISCELLANEOUS) IMPLANT
CHLORAPREP W/TINT 26ML (MISCELLANEOUS) ×2 IMPLANT
COVER SURGICAL LIGHT HANDLE (MISCELLANEOUS) ×2 IMPLANT
DECANTER SPIKE VIAL GLASS SM (MISCELLANEOUS) ×2 IMPLANT
DEVICE DUBIN SPECIMEN MAMMOGRA (MISCELLANEOUS) IMPLANT
DRAPE LAPAROTOMY T 98X78 PEDS (DRAPES) ×2 IMPLANT
DRAPE UTILITY XL STRL (DRAPES) ×4 IMPLANT
ELECT CAUTERY BLADE 6.4 (BLADE) ×2 IMPLANT
ELECT REM PT RETURN 9FT ADLT (ELECTROSURGICAL) ×2
ELECTRODE REM PT RTRN 9FT ADLT (ELECTROSURGICAL) ×1 IMPLANT
GAUZE SPONGE 4X4 16PLY XRAY LF (GAUZE/BANDAGES/DRESSINGS) ×2 IMPLANT
GLOVE BIOGEL PI IND STRL 6.5 (GLOVE) IMPLANT
GLOVE BIOGEL PI IND STRL 7.0 (GLOVE) IMPLANT
GLOVE BIOGEL PI IND STRL 7.5 (GLOVE) IMPLANT
GLOVE BIOGEL PI INDICATOR 6.5 (GLOVE) ×1
GLOVE BIOGEL PI INDICATOR 7.0 (GLOVE) ×1
GLOVE BIOGEL PI INDICATOR 7.5 (GLOVE) ×1
GLOVE ECLIPSE 7.5 STRL STRAW (GLOVE) ×1 IMPLANT
GLOVE SURG SIGNA 7.5 PF LTX (GLOVE) ×2 IMPLANT
GLOVE SURG SS PI 6.5 STRL IVOR (GLOVE) ×1 IMPLANT
GOWN STRL REUS W/ TWL LRG LVL3 (GOWN DISPOSABLE) ×1 IMPLANT
GOWN STRL REUS W/ TWL XL LVL3 (GOWN DISPOSABLE) ×1 IMPLANT
GOWN STRL REUS W/TWL LRG LVL3 (GOWN DISPOSABLE) ×2
GOWN STRL REUS W/TWL XL LVL3 (GOWN DISPOSABLE) ×2
KIT BASIN OR (CUSTOM PROCEDURE TRAY) ×2 IMPLANT
KIT MARKER MARGIN INK (KITS) IMPLANT
KIT ROOM TURNOVER OR (KITS) ×2 IMPLANT
LIQUID BAND (GAUZE/BANDAGES/DRESSINGS) ×2 IMPLANT
NDL HYPO 25GX1X1/2 BEV (NEEDLE) ×1 IMPLANT
NEEDLE HYPO 25GX1X1/2 BEV (NEEDLE) ×2 IMPLANT
NS IRRIG 1000ML POUR BTL (IV SOLUTION) ×2 IMPLANT
PACK SURGICAL SETUP 50X90 (CUSTOM PROCEDURE TRAY) ×2 IMPLANT
PAD ARMBOARD 7.5X6 YLW CONV (MISCELLANEOUS) ×2 IMPLANT
PENCIL BUTTON HOLSTER BLD 10FT (ELECTRODE) ×2 IMPLANT
SPECIMEN JAR LARGE (MISCELLANEOUS) ×1 IMPLANT
SUT MNCRL AB 4-0 PS2 18 (SUTURE) ×2 IMPLANT
SUT SILK 2 0 (SUTURE)
SUT SILK 2-0 18XBRD TIE 12 (SUTURE) IMPLANT
SUT VIC AB 3-0 SH 27 (SUTURE) ×2
SUT VIC AB 3-0 SH 27X BRD (SUTURE) ×1 IMPLANT
SYR BULB 3OZ (MISCELLANEOUS) ×2 IMPLANT
SYR CONTROL 10ML LL (SYRINGE) ×2 IMPLANT
TOWEL OR 17X24 6PK STRL BLUE (TOWEL DISPOSABLE) ×2 IMPLANT
TOWEL OR 17X26 10 PK STRL BLUE (TOWEL DISPOSABLE) ×2 IMPLANT
TUBE CONNECTING 12X1/4 (SUCTIONS) IMPLANT
YANKAUER SUCT BULB TIP NO VENT (SUCTIONS) IMPLANT

## 2015-12-31 NOTE — Discharge Instructions (Signed)
[facility] °Office Phone Number [phone] ° °BREAST BIOPSY/ PARTIAL MASTECTOMY: POST OP INSTRUCTIONS ° °Always review your discharge instruction sheet given to you by the facility where your surgery was performed. ° °IF YOU HAVE DISABILITY OR FAMILY LEAVE FORMS, YOU MUST BRING THEM TO THE OFFICE FOR PROCESSING.  DO NOT GIVE THEM TO YOUR DOCTOR. ° °1. A prescription for pain medication may be given to you upon discharge.  Take your pain medication as prescribed, if needed.  If narcotic pain medicine is not needed, then you may take acetaminophen (Tylenol) or ibuprofen (Advil) as needed. °2. Take your usually prescribed medications unless otherwise directed °3. If you need a refill on your pain medication, please contact your pharmacy.  They will contact our office to request authorization.  Prescriptions will not be filled after 5pm or on week-ends. °4. You should eat very light the first 24 hours after surgery, such as soup, crackers, pudding, etc.  Resume your normal diet the day after surgery. °5. Most patients will experience some swelling and bruising in the breast.  Ice packs and a good support bra will help.  Swelling and bruising can take several days to resolve.  °6. It is common to experience some constipation if taking pain medication after surgery.  Increasing fluid intake and taking a stool softener will usually help or prevent this problem from occurring.  A mild laxative (Milk of Magnesia or Miralax) should be taken according to package directions if there are no bowel movements after 48 hours. °7. Unless discharge instructions indicate otherwise, you may remove your bandages 24-48 hours after surgery, and you may shower at that time.  You may have steri-strips (small skin tapes) in place directly over the incision.  These strips should be left on the skin for 7-10 days.  If your surgeon used skin glue on the incision, you may shower in 24 hours.  The glue will flake off over the  next 2-3 weeks.  Any sutures or staples will be removed at the office during your follow-up visit. °8. ACTIVITIES:  You may resume regular daily activities (gradually increasing) beginning the next day.  Wearing a good support bra or sports bra minimizes pain and swelling.  You may have sexual intercourse when it is comfortable. °a. You may drive when you no longer are taking prescription pain medication, you can comfortably wear a seatbelt, and you can safely maneuver your car and apply brakes. °b. RETURN TO WORK:  ______________________________________________________________________________________ °9. You should see your doctor in the office for a follow-up appointment approximately two weeks after your surgery.  Your doctor’s nurse will typically make your follow-up appointment when she calls you with your pathology report.  Expect your pathology report 2-3 business days after your surgery.  You may call to check if you do not hear from us after three days. °10. OTHER INSTRUCTIONS: ____ok to shower tomorrow °11. Ice pack and ibuprofen also for pain___________________________________________________________________________________________ _____________________________________________________________________________________________________________________________________ °_____________________________________________________________________________________________________________________________________ °_____________________________________________________________________________________________________________________________________ ° °WHEN TO CALL YOUR DOCTOR: °1. Fever over 101.0 °2. Nausea and/or vomiting. °3. Extreme swelling or bruising. °4. Continued bleeding from incision. °5. Increased pain, redness, or drainage from the incision. ° °The clinic staff is available to answer your questions during regular business hours.  Please don’t hesitate to call and ask to speak to one of the nurses for clinical  concerns.  If you have a medical emergency, go to the nearest emergency room or call 911.  A surgeon from [facility] is always on call   at the hospital. ° °For further questions, please visit [email or web address]  °

## 2015-12-31 NOTE — Anesthesia Postprocedure Evaluation (Signed)
Anesthesia Post Note  Patient: Leslie Hines  Procedure(s) Performed: Procedure(s) (LRB): LEFT BREAST LUMPECTOMY (Left)  Patient location during evaluation: PACU Anesthesia Type: General Level of consciousness: awake and alert Pain management: pain level controlled Vital Signs Assessment: post-procedure vital signs reviewed and stable Respiratory status: spontaneous breathing, nonlabored ventilation and respiratory function stable Cardiovascular status: blood pressure returned to baseline and stable Postop Assessment: no signs of nausea or vomiting Anesthetic complications: no    Last Vitals:  Vitals:   12/31/15 1045 12/31/15 1100  BP: 129/82 124/83  Pulse: 72 70  Resp: 11 18  Temp:  36.5 C    Last Pain:  Vitals:   12/31/15 1045  TempSrc:   PainSc: Asleep                 Meeya Goldin,W. EDMOND

## 2015-12-31 NOTE — Interval H&P Note (Signed)
History and Physical Interval Note: left breast mass.  Left breast lumpectomy planned.  No change in H and P  12/31/2015 8:06 AM  Leslie Hines  has presented today for surgery, with the diagnosis of LEFT BREAST MASS  The various methods of treatment have been discussed with the patient and family. After consideration of risks, benefits and other options for treatment, the patient has consented to  Procedure(s): LEFT BREAST LUMPECTOMY (Left) as a surgical intervention .  The patient's history has been reviewed, patient examined, no change in status, stable for surgery.  I have reviewed the patient's chart and labs.  Questions were answered to the patient's satisfaction.     Parissa Chiao A

## 2015-12-31 NOTE — Transfer of Care (Signed)
Immediate Anesthesia Transfer of Care Note  Patient: LEANER BUSA  Procedure(s) Performed: Procedure(s): LEFT BREAST LUMPECTOMY (Left)  Patient Location: PACU  Anesthesia Type:General  Level of Consciousness: awake, alert  and oriented  Airway & Oxygen Therapy: Patient Spontanous Breathing and Patient connected to nasal cannula oxygen  Post-op Assessment: Report given to RN and Post -op Vital signs reviewed and stable  Post vital signs: Reviewed and stable  Last Vitals:  Vitals:   12/31/15 0803  BP: 132/86  Pulse: 79  Resp: 20  Temp: 36.9 C    Last Pain:  Vitals:   12/31/15 0803  TempSrc: Oral      Patients Stated Pain Goal: 4 ([id number])  Complications: No apparent anesthesia complications

## 2015-12-31 NOTE — Op Note (Signed)
LEFT BREAST LUMPECTOMY  Procedure Note  Leslie Hines 12/31/2015   Pre-op Diagnosis: LEFT BREAST Kerin Ransom MASS     Post-op Diagnosis: same  Procedure(s): EXCISION LEFT BREAST/AXILLARY MASS (10 CM)  Surgeon(s): Coralie Keens, MD  Anesthesia: General  Staff:  Circulator: Harrel Lemon, RN Scrub Person: Christeen Paxton Kanaan, CST Circulator Assistant: Baldemar Lenis, RN RN First Assistant: Cyd Silence, RN  Estimated Blood Loss: Minimal               Specimens: SENT TO PATH.  PROBABLE LIPOMA          Leslie Hines   Date: 12/31/2015  Time: 10:03 AM

## 2015-12-31 NOTE — Anesthesia Procedure Notes (Addendum)
Procedure Name: LMA Insertion Date/Time: 12/31/2015 9:37 AM Performed by: Merdis Delay Pre-anesthesia Checklist: Patient identified, Emergency Drugs available, Suction available, Timeout performed and Patient being monitored Patient Re-evaluated:Patient Re-evaluated prior to inductionOxygen Delivery Method: Circle system utilized Preoxygenation: Pre-oxygenation with 100% oxygen Intubation Type: IV induction LMA: LMA inserted LMA Size: 4.0 Number of attempts: 1 Placement Confirmation: positive ETCO2,  CO2 detector and breath sounds checked- equal and bilateral Tube secured with: Tape Dental Injury: Teeth and Oropharynx as per pre-operative assessment  Comments: Performed by jennifer cambell srna

## 2015-12-31 NOTE — Anesthesia Preprocedure Evaluation (Addendum)
Anesthesia Evaluation  Patient identified by MRN, date of birth, ID band Patient awake    Reviewed: Allergy & Precautions, H&P , NPO status , Patient's Chart, lab work & pertinent test results  Airway Mallampati: II  TM Distance: >3 FB Neck ROM: Full    Dental no notable dental hx. (+) Teeth Intact, Dental Advisory Given   Pulmonary asthma ,    Pulmonary exam normal breath sounds clear to auscultation       Cardiovascular hypertension, Pt. on medications  Rhythm:Regular Rate:Normal     Neuro/Psych Anxiety negative neurological ROS  negative psych ROS   GI/Hepatic negative GI ROS, Neg liver ROS,   Endo/Other  Hypothyroidism   Renal/GU negative Renal ROS  negative genitourinary   Musculoskeletal  (+) Arthritis , Osteoarthritis,    Abdominal   Peds  Hematology negative hematology ROS (+)   Anesthesia Other Findings   Reproductive/Obstetrics negative OB ROS                            Anesthesia Physical Anesthesia Plan  ASA: II  Anesthesia Plan: General   Post-op Pain Management:    Induction: Intravenous  Airway Management Planned: LMA  Additional Equipment:   Intra-op Plan:   Post-operative Plan: Extubation in OR  Informed Consent: I have reviewed the patients History and Physical, chart, labs and discussed the procedure including the risks, benefits and alternatives for the proposed anesthesia with the patient or authorized representative who has indicated his/her understanding and acceptance.   Dental advisory given  Plan Discussed with: CRNA  Anesthesia Plan Comments:         Anesthesia Quick Evaluation

## 2016-01-01 ENCOUNTER — Encounter (HOSPITAL_COMMUNITY): Payer: Self-pay | Admitting: Surgery

## 2016-01-01 NOTE — Op Note (Signed)
NAMEKIMIKA, [name]                   ACCOUNT NO.:  [id number]  MEDICAL RECORD NO.:  [id number]  LOCATION:  [facility]                         FACILITY:  [facility]  PHYSICIAN:  Coralie Keens, M.D. DATE OF BIRTH:  1950-10-13  DATE OF PROCEDURE:  12/31/2015 DATE OF DISCHARGE:  12/31/2015                              OPERATIVE REPORT   PREOPERATIVE DIAGNOSIS:  Left breast/axillary mass.  POSTOPERATIVE DIAGNOSIS:  Left breast/axillary mass.  PROCEDURE:  Excision of left breast/axillary mass (10 cm).  SURGEON:  Coralie Keens, M.D.  ANESTHESIA:  General and 0.5% Marcaine.  ESTIMATED BLOOD LOSS:  Minimal.  INDICATIONS:  This is a 65 year old female, who has palpated a large mass in the tail of Spence of the left breast going into the axilla. Mammogram and ultrasound were unremarkable for a specific breast pathology.  The mass is easily palpable, so decision was made to proceed with excision.  FINDINGS:  The patient was found to have large 10-cm mass, which appeared consistent with a very large lipoma.  There was no other pathology identified in the breast or in the axilla.  PROCEDURE IN DETAIL:  The patient was brought to the operating room, identified as Leslie Hines.  She was placed supine on the operating table and general anesthesia was induced.  Her left axilla and breast were prepped and draped in usual sterile fashion.  I anesthetized the skin at the left axilla with Marcaine.  I then made a longitudinal incision with the scalpel.  I took this down to the breast tissue and axillary tissue with electrocautery.  I then encountered a very large lipoma, which was easily elevated out of the wound.  I excised in its entirety with electrocautery.  Once small vessel that was going to lipoma was tied off with a 2-0 silk suture.  Hemostasis was then appeared to be achieved with a cautery.  We irrigated the wound with saline.  I anesthetized it further with Marcaine.  I then closed the  subcutaneous tissue with interrupted 3-0 Vicryl sutures and closed the skin with a running 4-0 Monocryl.  The mass was sent to Pathology.  Skin glue was then applied. The patient tolerated the procedure well.  All sponge, needle, and instrument counts were correct at the end of procedure.  The patient was then extubated in the operating room and taken in a stable condition to the recovery room.     Coralie Keens, M.D.     DB/MEDQ  D:  12/31/2015  T:  01/01/2016  Job:  [id number]

## 2016-12-18 ENCOUNTER — Other Ambulatory Visit: Payer: Self-pay | Admitting: Cardiovascular Disease

## 2016-12-20 NOTE — Telephone Encounter (Signed)
REFILL 

## 2017-03-04 ENCOUNTER — Other Ambulatory Visit: Payer: Self-pay | Admitting: Surgery

## 2017-03-04 DIAGNOSIS — Z1231 Encounter for screening mammogram for malignant neoplasm of breast: Secondary | ICD-10-CM

## 2017-03-15 ENCOUNTER — Other Ambulatory Visit: Payer: Self-pay | Admitting: Cardiovascular Disease

## 2017-03-15 NOTE — Telephone Encounter (Signed)
REFILL 

## 2017-04-01 ENCOUNTER — Ambulatory Visit
Admission: RE | Admit: 2017-04-01 | Discharge: 2017-04-01 | Disposition: A | Discharge status: Home / Self Care | Payer: Managed Care, Other (non HMO) | Source: Ambulatory Visit | Attending: Surgery | Admitting: Surgery

## 2017-04-01 DIAGNOSIS — Z1231 Encounter for screening mammogram for malignant neoplasm of breast: Secondary | ICD-10-CM

## 2017-04-06 ENCOUNTER — Other Ambulatory Visit: Payer: Self-pay | Admitting: Surgery

## 2017-04-06 DIAGNOSIS — R928 Other abnormal and inconclusive findings on diagnostic imaging of breast: Secondary | ICD-10-CM

## 2017-04-14 ENCOUNTER — Ambulatory Visit
Admission: RE | Admit: 2017-04-14 | Discharge: 2017-04-14 | Disposition: A | Discharge status: Home / Self Care | Payer: Managed Care, Other (non HMO) | Source: Ambulatory Visit | Attending: Surgery | Admitting: Surgery

## 2017-04-14 ENCOUNTER — Other Ambulatory Visit: Payer: Self-pay | Admitting: Surgery

## 2017-04-14 DIAGNOSIS — R928 Other abnormal and inconclusive findings on diagnostic imaging of breast: Secondary | ICD-10-CM

## 2017-06-17 ENCOUNTER — Other Ambulatory Visit: Payer: Self-pay | Admitting: Cardiovascular Disease

## 2017-06-17 NOTE — Telephone Encounter (Signed)
REFILL 

## 2017-07-20 ENCOUNTER — Other Ambulatory Visit: Payer: Self-pay | Admitting: Cardiovascular Disease

## 2017-08-28 ENCOUNTER — Other Ambulatory Visit: Payer: Self-pay | Admitting: Cardiovascular Disease

## 2017-08-29 NOTE — Telephone Encounter (Signed)
REFILL 

## 2017-09-13 ENCOUNTER — Other Ambulatory Visit: Payer: Self-pay | Admitting: Cardiovascular Disease

## 2017-09-13 NOTE — Telephone Encounter (Signed)
Rx request sent to pharmacy.  

## 2017-10-09 ENCOUNTER — Other Ambulatory Visit: Payer: Self-pay | Admitting: Cardiovascular Disease

## 2017-10-22 ENCOUNTER — Other Ambulatory Visit: Payer: Self-pay | Admitting: Cardiovascular Disease

## 2017-11-11 ENCOUNTER — Other Ambulatory Visit: Payer: Self-pay | Admitting: Cardiovascular Disease

## 2017-11-11 NOTE — Telephone Encounter (Signed)
Rx request sent to pharmacy.  

## 2017-11-30 ENCOUNTER — Other Ambulatory Visit: Payer: Self-pay | Admitting: Cardiovascular Disease

## 2017-11-30 NOTE — Telephone Encounter (Signed)
Rx sent to pharmacy   

## 2017-12-08 ENCOUNTER — Other Ambulatory Visit: Payer: Self-pay | Admitting: Obstetrics & Gynecology

## 2017-12-08 DIAGNOSIS — N6320 Unspecified lump in the left breast, unspecified quadrant: Secondary | ICD-10-CM

## 2017-12-12 ENCOUNTER — Other Ambulatory Visit: Payer: Managed Care, Other (non HMO)

## 2017-12-15 ENCOUNTER — Ambulatory Visit
Admission: RE | Admit: 2017-12-15 | Discharge: 2017-12-15 | Disposition: A | Discharge status: Home / Self Care | Payer: BLUE CROSS/BLUE SHIELD | Source: Ambulatory Visit | Attending: Obstetrics & Gynecology | Admitting: Obstetrics & Gynecology

## 2017-12-15 ENCOUNTER — Ambulatory Visit
Admission: RE | Admit: 2017-12-15 | Discharge: 2017-12-15 | Disposition: A | Discharge status: Home / Self Care | Payer: Managed Care, Other (non HMO) | Source: Ambulatory Visit | Attending: Obstetrics & Gynecology | Admitting: Obstetrics & Gynecology

## 2017-12-15 DIAGNOSIS — N6320 Unspecified lump in the left breast, unspecified quadrant: Secondary | ICD-10-CM

## 2018-03-29 ENCOUNTER — Other Ambulatory Visit: Payer: Self-pay | Admitting: Obstetrics & Gynecology

## 2018-03-29 DIAGNOSIS — Z1231 Encounter for screening mammogram for malignant neoplasm of breast: Secondary | ICD-10-CM

## 2018-04-04 DIAGNOSIS — Z86010 Personal history of colonic polyps: Secondary | ICD-10-CM | POA: Diagnosis not present

## 2018-04-04 DIAGNOSIS — K648 Other hemorrhoids: Secondary | ICD-10-CM | POA: Diagnosis not present

## 2018-04-04 DIAGNOSIS — Z1211 Encounter for screening for malignant neoplasm of colon: Secondary | ICD-10-CM | POA: Diagnosis not present

## 2018-04-04 DIAGNOSIS — K5730 Diverticulosis of large intestine without perforation or abscess without bleeding: Secondary | ICD-10-CM | POA: Diagnosis not present

## 2018-04-11 DIAGNOSIS — R69 Illness, unspecified: Secondary | ICD-10-CM | POA: Diagnosis not present

## 2018-04-13 ENCOUNTER — Ambulatory Visit
Admission: RE | Admit: 2018-04-13 | Discharge: 2018-04-13 | Disposition: A | Discharge status: Home / Self Care | Payer: Medicare HMO | Source: Ambulatory Visit | Attending: Obstetrics & Gynecology | Admitting: Obstetrics & Gynecology

## 2018-04-13 DIAGNOSIS — Z1231 Encounter for screening mammogram for malignant neoplasm of breast: Secondary | ICD-10-CM

## 2018-04-14 DIAGNOSIS — M19012 Primary osteoarthritis, left shoulder: Secondary | ICD-10-CM | POA: Diagnosis not present

## 2018-06-06 DIAGNOSIS — J45909 Unspecified asthma, uncomplicated: Secondary | ICD-10-CM | POA: Diagnosis not present

## 2018-06-06 DIAGNOSIS — J40 Bronchitis, not specified as acute or chronic: Secondary | ICD-10-CM | POA: Diagnosis not present

## 2018-07-06 ENCOUNTER — Institutional Professional Consult (permissible substitution): Payer: Medicare HMO | Admitting: Pulmonary Disease

## 2018-07-18 ENCOUNTER — Institutional Professional Consult (permissible substitution): Payer: Medicare HMO | Admitting: Internal Medicine

## 2018-08-28 ENCOUNTER — Institutional Professional Consult (permissible substitution): Payer: Medicare HMO | Admitting: Internal Medicine

## 2018-08-28 DIAGNOSIS — S61411A Laceration without foreign body of right hand, initial encounter: Secondary | ICD-10-CM | POA: Diagnosis not present

## 2018-09-06 DIAGNOSIS — E039 Hypothyroidism, unspecified: Secondary | ICD-10-CM | POA: Diagnosis not present

## 2018-09-08 ENCOUNTER — Other Ambulatory Visit: Payer: Self-pay

## 2018-09-08 ENCOUNTER — Encounter: Payer: Self-pay | Admitting: Internal Medicine

## 2018-09-08 ENCOUNTER — Institutional Professional Consult (permissible substitution): Payer: Medicare HMO | Admitting: Internal Medicine

## 2018-09-08 ENCOUNTER — Ambulatory Visit: Payer: Medicare HMO | Admitting: Internal Medicine

## 2018-09-08 ENCOUNTER — Ambulatory Visit (INDEPENDENT_AMBULATORY_CARE_PROVIDER_SITE_OTHER): Payer: Medicare HMO

## 2018-09-08 DIAGNOSIS — R05 Cough: Secondary | ICD-10-CM | POA: Diagnosis not present

## 2018-09-08 DIAGNOSIS — J45991 Cough variant asthma: Secondary | ICD-10-CM | POA: Insufficient documentation

## 2018-09-08 MED ORDER — MOMETASONE FURO-FORMOTEROL FUM 100-5 MCG/ACT IN AERO: 2.0000 | INHALATION_SPRAY | Freq: Two times a day (BID) | RESPIRATORY_TRACT | 11 refills | Status: DC

## 2018-09-08 MED ORDER — MOMETASONE FURO-FORMOTEROL FUM 100-5 MCG/ACT IN AERO: 2.0000 | INHALATION_SPRAY | Freq: Two times a day (BID) | RESPIRATORY_TRACT | 0 refills | Status: DC

## 2018-09-08 NOTE — Patient Instructions (Addendum)
For shortness of breath, cough, wheeze > Dulera 100 Take 2 puffs first thing in am and then another 2 puffs about 12 hours later.    Work on inhaler technique:  relax and gently blow all the way out then take a nice smooth deep breath back in, triggering the inhaler at same time you start breathing in.  Hold for up to 5 seconds if you can. Blow out thru nose. Rinse and gargle with water when done     Only use your albuterol as a rescue medication to be used if you can't catch your breath by resting or doing a relaxed purse lip breathing pattern.  - The less you use it, the better it will work when you need it. - Ok to use up to 2 puffs  every 4 hours if you must but call for immediate appointment if use goes up over your usual need - Don't leave home without it !!  (think of it like the spare tire for your car)    Please remember to go to the  x-ray department  for your tests - we will call you with the results when they are available    Pulmonary follow up is as needed

## 2018-09-08 NOTE — Progress Notes (Signed)
Called and left detailed msg on machine with results

## 2018-09-08 NOTE — Progress Notes (Signed)
Leslie Hines, female    DOB: Oct 27, 1950,     MRN: [id number]   Brief patient profile:  5 yowf never smoked with cough/sob around 1978 eval by Dr Leslie Hines  with dx asthma/allergies rx shots for 5 years some better but eventually placed on advair 250 bid with typical  flares about twice yearly and did fine until Oct 2019 due to oral irritation from advair  and left it off/ maintained on singulair but then flared in Jan 2020  And since restarting "prn" advair has had very difficult to control pattern of "cold and sore throat go to chest so referred to pulmonary clinic 09/08/2018 by Dr   Leslie Hines with concern re:  freq pred rx needed to control.    History of Present Illness  09/08/2018  Pulmonary/ 1st office eval/Leslie Hines  Chief Complaint  Patient presents with  . Pulm Consult    Referred by PCP for possible asthmatic bronchitis. Has had several flare ups over the past few years.   Dyspnea:  Not really  limited by breathing from desired activities   Cough: some throat clearing daytimes, some am cough/congestion slt yellow  Sleep: ok lying flat SABA use: twice a month  No obvious day to day or daytime variability or assoc excess/ purulent sputum or mucus plugs or hemoptysis or cp or chest tightness, subjective wheeze or overt sinus or hb symptoms.   Sleeping as above  without nocturnal  or early am exacerbation  of respiratory  c/o's or need for noct saba. Also denies any obvious fluctuation of symptoms with weather or environmental changes or other aggravating or alleviating factors except as outlined above   No unusual exposure hx or h/o childhood pna/ asthma or knowledge of premature birth.  Current Allergies, Complete Past Medical History, Past Surgical History, Family History, and Social History were reviewed in [employer] record.  ROS  The following are not active complaints unless bolded Hoarseness, sore throat, dysphagia, dental problems= lichen planus, itching,  sneezing,  nasal congestion or discharge of excess mucus or purulent secretions, ear ache,   fever, chills, sweats, unintended wt loss or wt gain, classically pleuritic or exertional cp,  orthopnea pnd or arm/hand swelling  or leg swelling, presyncope, palpitations, abdominal pain, anorexia, nausea, vomiting, diarrhea  or change in bowel habits or change in bladder habits, change in stools or change in urine, dysuria, hematuria,  rash, arthralgias, visual complaints, headache, numbness, weakness or ataxia or problems with walking or coordination,  change in mood or  memory.             Past Medical History:  Diagnosis Date  . Anxiety   . Arthritis    lt shoulder  . Asthma    allergy related  . Breast mass, left   . Environmental allergies   . Hypertension   . Hypothyroidism     Outpatient Medications Prior to Visit  Medication Sig Dispense Refill  . albuterol (PROVENTIL HFA;VENTOLIN HFA) 108 (90 Base) MCG/ACT inhaler Inhale 1-2 puffs into the lungs every 6 (six) hours as needed for wheezing or shortness of breath.     . ALPRAZolam (XANAX) 0.5 MG tablet Take 0.5 mg by mouth daily as needed for anxiety.     Marland Kitchen estradiol (ESTRACE) 2 MG tablet Take 1 mg by mouth daily. 1/2 tablet daily    . Fluticasone-Salmeterol (ADVAIR) 250-50 MCG/DOSE AEPB Inhale 1 puff into the lungs 2 (two) times daily.     . hydrochlorothiazide (HYDRODIURIL)  25 MG tablet Take 1 tablet (25 mg total) by mouth daily. NEED OV for further refills. 15 tablet 0  . levothyroxine (SYNTHROID, LEVOTHROID) 75 MCG tablet Take 75 mcg by mouth daily before breakfast.    . magnesium 30 MG tablet Take 30 mg by mouth daily as needed.     . montelukast (SINGULAIR) 10 MG tablet Take 10 mg by mouth daily.     Marland Kitchen POTASSIUM PO Take 1 tablet by mouth daily.    Marland Kitchen zolpidem (AMBIEN) 5 MG tablet Take 5 mg by mouth at bedtime as needed for sleep.    .       . oxyCODONE-acetaminophen (ROXICET) 5-325 MG tablet Take 1-2 tablets by mouth every 4  (four) hours as needed for moderate pain or severe pain. 40 tablet 0  . zolpidem (AMBIEN CR) 12.5 MG CR tablet Take 12.5 mg by mouth at bedtime.  4      Objective:     BP 120/78 (BP Location: Left Arm, Patient Position: Sitting, Cuff Size: Normal)   Pulse 95   Ht 5\' 5"  (1.651 m)   Wt 165 lb (74.8 kg)   SpO2 98%   BMI 27.46 kg/m   SpO2: 98 %   RA   HEENT: nl dentition, turbinates bilaterally, and oropharynx. Nl external ear canals without cough reflex   NECK :  without JVD/Nodes/TM/ nl carotid upstrokes bilaterally   LUNGS: no acc muscle use,  Nl contour chest which is clear to A and P bilaterally without cough on insp or exp maneuvers   CV:  RRR  no s3 or murmur or increase in P2, and no edema   ABD:  soft and nontender with nl inspiratory excursion in the supine position. No bruits or organomegaly appreciated, bowel sounds nl  MS:  Nl gait/ ext warm without deformities, calf tenderness, cyanosis or clubbing No obvious joint restrictions   SKIN: warm and dry without lesions    NEURO:  alert, approp, nl sensorium with  no motor or cerebellar deficits apparent.    CXR PA and Lateral:   09/08/2018 :    I personally reviewed images and agree with radiology impression as follows:    Hiatal hernia- moderate to large. No acute intrathoracic abnormality is noted.     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 and pos response to allergy rx x 5 y with Dr Leslie Hines - 09/08/2018  After extensive coaching inhaler device,  effectiveness = 75% so try dulera 100 2bid prn flare / maint singulair    DDX of  difficult airways management almost all start with A and  include Adherence, Ace Inhibitors, Acid Reflux, Active Sinus Disease, Alpha 1 Antitripsin deficiency, Anxiety masquerading as Airways dz,  ABPA,  Allergy(esp in young), Aspiration (esp in elderly), Adverse effects of meds,  Active smoking or vaping, A bunch of PE's (a small clot burden can't cause this syndrome  unless there is already severe underlying pulm or vascular dz with poor reserve) plus two Bs  = Bronchiectasis and Beta blocker use..and one C= CHF   Adherence is always the initial "prime suspect" and is a multilayered concern that requires a "trust but verify" approach in every patient - starting with knowing how to use medications, especially inhalers, correctly, keeping up with refills and understanding the fundamental difference between maintenance and prns vs those medications only taken for a very short course and then stopped and not refilled.  - see hfa teaching - if not  satisfied with recs first step is to return with all meds in hand using a trust but verify approach to confirm accurate Medication  Reconciliation The principal here is that until we are certain that the  patients are doing what we've asked, it makes no sense to ask them to do more.    ? Adverse effects of DPI on the upper airway, especially advair, which I've stopped using due to so much overlap with asthma /bronchitis symptoms > try hfa dulera prn - Based on two studies from NEJM  378; 20 p 1865 (2018) and 380 : p2020-30 (2019) in pts with mild asthma it is reasonable to use low dose symbicort eg 80(and by inference dulera 100 ( 2bid "prn" flare in this setting but I emphasized this was only shown with symbicort and takes advantage of the rapid onset of action but is not the same as "rescue therapy" but can be stopped once the acute symptoms have resolved and the need for rescue has been minimized (< 2 x weekly)    ? Allergy>  Continue singulair, consider allergy testing if not responding to above rx  ? Active sinus dz > also consider sinus CT next  ? Acid (or non-acid) GERD > always difficult to exclude as up to 75% of pts in some series report no assoc GI/ Heartburn symptoms and she has a large HH  - Of the three most common causes of  Sub-acute / recurrent or chronic cough, only one (GERD)  can actually contribute to/  trigger  the other two (asthma and post nasal drip syndrome)  and perpetuate the cylce of cough. While not intuitively obvious, many patients with chronic low grade reflux do not cough until there is a primary insult that disturbs the protective epithelial barrier and exposes sensitive nerve endings.   This is typically viral but can due to PNDS and  either may apply here so low threshold to rx for gerd short term for cough flares.  ? Bronchiectasis developing > if cough with discolored sputum becomes more of a chronic problem then HRCT should be considered       Total time devoted to counseling  > 50 % of initial 60 min office visit:  review case with pt/  device teaching which extended face to face time for this visit / discussion of options/alternatives/ personally creating written customized instructions  in presence of pt  then going over those specific  Instructions directly with the pt including how to use all of the meds but in particular covering each new medication in detail and the difference between the maintenance= "automatic" meds and the prns using an action plan format for the latter (If this problem/symptom => do that organization reading Left to right).  Please see AVS from this visit for a full list of these instructions which I personally wrote for this pt and  are unique to this visit.        Christinia Gully, MD 09/08/2018

## 2018-09-09 ENCOUNTER — Encounter: Payer: Self-pay | Admitting: Internal Medicine

## 2018-09-09 NOTE — Assessment & Plan Note (Signed)
Onset in 1970s and pos response to allergy rx x 5 y with Dr Bernita Buffy - 09/08/2018  After extensive coaching inhaler device,  effectiveness = 75% so try dulera 100 2bid prn flare / maint singulair    DDX of  difficult airways management almost all start with A and  include Adherence, Ace Inhibitors, Acid Reflux, Active Sinus Disease, Alpha 1 Antitripsin deficiency, Anxiety masquerading as Airways dz,  ABPA,  Allergy(esp in young), Aspiration (esp in elderly), Adverse effects of meds,  Active smoking or vaping, A bunch of PE's (a small clot burden can't cause this syndrome unless there is already severe underlying pulm or vascular dz with poor reserve) plus two Bs  = Bronchiectasis and Beta blocker use..and one C= CHF   Adherence is always the initial "prime suspect" and is a multilayered concern that requires a "trust but verify" approach in every patient - starting with knowing how to use medications, especially inhalers, correctly, keeping up with refills and understanding the fundamental difference between maintenance and prns vs those medications only taken for a very short course and then stopped and not refilled.  - see hfa teaching - if not satisfied with recs first step is to return with all meds in hand using a trust but verify approach to confirm accurate Medication  Reconciliation The principal here is that until we are certain that the  patients are doing what we've asked, it makes no sense to ask them to do more.    ? Adverse effects of DPI on the upper airway, especially advair, which I've stopped using due to so much overlap with asthma /bronchitis symptoms > try hfa dulera prn - Based on two studies from NEJM  378; 20 p 1865 (2018) and 380 : p2020-30 (2019) in pts with mild asthma it is reasonable to use low dose symbicort eg 80(and by inference dulera 100 ( 2bid "prn" flare in this setting but I emphasized this was only shown with symbicort and takes advantage of the rapid onset of  action but is not the same as "rescue therapy" but can be stopped once the acute symptoms have resolved and the need for rescue has been minimized (< 2 x weekly)    ? Allergy>  Continue singulair, consider allergy testing if not responding to above rx  ? Active sinus dz > also consider sinus CT next  ? Acid (or non-acid) GERD > always difficult to exclude as up to 75% of pts in some series report no assoc GI/ Heartburn symptoms and she has a large HH  - Of the three most common causes of  Sub-acute / recurrent or chronic cough, only one (GERD)  can actually contribute to/ trigger  the other two (asthma and post nasal drip syndrome)  and perpetuate the cylce of cough. While not intuitively obvious, many patients with chronic low grade reflux do not cough until there is a primary insult that disturbs the protective epithelial barrier and exposes sensitive nerve endings.   This is typically viral but can due to PNDS and  either may apply here so low threshold to rx for gerd short term for cough flares.  ? Bronchiectasis developing > if cough with discolored sputum becomes more of a chronic problem then HRCT should be considered    Total time devoted to counseling  > 50 % of initial 60 min office visit:  review case with pt/  device teaching which extended face to face time for this visit / discussion of  options/alternatives/ personally creating written customized instructions  in presence of pt  then going over those specific  Instructions directly with the pt including how to use all of the meds but in particular covering each new medication in detail and the difference between the maintenance= "automatic" meds and the prns using an action plan format for the latter (If this problem/symptom => do that organization reading Left to right).  Please see AVS from this visit for a full list of these instructions which I personally wrote for this pt and  are unique to this visit.

## 2018-09-10 DIAGNOSIS — S61411D Laceration without foreign body of right hand, subsequent encounter: Secondary | ICD-10-CM | POA: Diagnosis not present

## 2018-09-10 DIAGNOSIS — Z4802 Encounter for removal of sutures: Secondary | ICD-10-CM | POA: Diagnosis not present

## 2018-09-14 DIAGNOSIS — Z4802 Encounter for removal of sutures: Secondary | ICD-10-CM | POA: Diagnosis not present

## 2018-09-14 DIAGNOSIS — S61411D Laceration without foreign body of right hand, subsequent encounter: Secondary | ICD-10-CM | POA: Diagnosis not present

## 2018-10-13 DIAGNOSIS — H524 Presbyopia: Secondary | ICD-10-CM | POA: Diagnosis not present

## 2018-10-13 DIAGNOSIS — H5203 Hypermetropia, bilateral: Secondary | ICD-10-CM | POA: Diagnosis not present

## 2018-10-16 DIAGNOSIS — Z0100 Encounter for examination of eyes and vision without abnormal findings: Secondary | ICD-10-CM | POA: Diagnosis not present

## 2018-11-02 DIAGNOSIS — R69 Illness, unspecified: Secondary | ICD-10-CM | POA: Diagnosis not present

## 2018-11-07 DIAGNOSIS — R69 Illness, unspecified: Secondary | ICD-10-CM | POA: Diagnosis not present

## 2018-12-11 DIAGNOSIS — Z01419 Encounter for gynecological examination (general) (routine) without abnormal findings: Secondary | ICD-10-CM | POA: Diagnosis not present

## 2019-01-23 DIAGNOSIS — E7800 Pure hypercholesterolemia, unspecified: Secondary | ICD-10-CM | POA: Diagnosis not present

## 2019-01-23 DIAGNOSIS — R7301 Impaired fasting glucose: Secondary | ICD-10-CM | POA: Diagnosis not present

## 2019-01-23 DIAGNOSIS — E039 Hypothyroidism, unspecified: Secondary | ICD-10-CM | POA: Diagnosis not present

## 2019-01-24 DIAGNOSIS — M5432 Sciatica, left side: Secondary | ICD-10-CM | POA: Diagnosis not present

## 2019-02-01 DIAGNOSIS — M25559 Pain in unspecified hip: Secondary | ICD-10-CM | POA: Diagnosis not present

## 2019-02-01 DIAGNOSIS — E038 Other specified hypothyroidism: Secondary | ICD-10-CM | POA: Diagnosis not present

## 2019-02-01 DIAGNOSIS — I10 Essential (primary) hypertension: Secondary | ICD-10-CM | POA: Diagnosis not present

## 2019-02-01 DIAGNOSIS — J40 Bronchitis, not specified as acute or chronic: Secondary | ICD-10-CM | POA: Diagnosis not present

## 2019-02-01 DIAGNOSIS — G8929 Other chronic pain: Secondary | ICD-10-CM | POA: Diagnosis not present

## 2019-02-01 DIAGNOSIS — J45909 Unspecified asthma, uncomplicated: Secondary | ICD-10-CM | POA: Diagnosis not present

## 2019-02-01 DIAGNOSIS — Z1159 Encounter for screening for other viral diseases: Secondary | ICD-10-CM | POA: Diagnosis not present

## 2019-02-01 DIAGNOSIS — Z23 Encounter for immunization: Secondary | ICD-10-CM | POA: Diagnosis not present

## 2019-02-01 DIAGNOSIS — R739 Hyperglycemia, unspecified: Secondary | ICD-10-CM | POA: Diagnosis not present

## 2019-02-01 DIAGNOSIS — Z0000 Encounter for general adult medical examination without abnormal findings: Secondary | ICD-10-CM | POA: Diagnosis not present

## 2019-02-07 DIAGNOSIS — E039 Hypothyroidism, unspecified: Secondary | ICD-10-CM | POA: Diagnosis not present

## 2019-02-07 DIAGNOSIS — R7301 Impaired fasting glucose: Secondary | ICD-10-CM | POA: Diagnosis not present

## 2019-02-07 DIAGNOSIS — E7800 Pure hypercholesterolemia, unspecified: Secondary | ICD-10-CM | POA: Diagnosis not present

## 2019-02-07 DIAGNOSIS — I10 Essential (primary) hypertension: Secondary | ICD-10-CM | POA: Diagnosis not present

## 2019-02-08 DIAGNOSIS — E663 Overweight: Secondary | ICD-10-CM | POA: Diagnosis not present

## 2019-02-08 DIAGNOSIS — R293 Abnormal posture: Secondary | ICD-10-CM | POA: Diagnosis not present

## 2019-02-08 DIAGNOSIS — M799 Soft tissue disorder, unspecified: Secondary | ICD-10-CM | POA: Diagnosis not present

## 2019-02-08 DIAGNOSIS — R262 Difficulty in walking, not elsewhere classified: Secondary | ICD-10-CM | POA: Diagnosis not present

## 2019-02-08 DIAGNOSIS — M25552 Pain in left hip: Secondary | ICD-10-CM | POA: Diagnosis not present

## 2019-02-08 DIAGNOSIS — M25652 Stiffness of left hip, not elsewhere classified: Secondary | ICD-10-CM | POA: Diagnosis not present

## 2019-02-08 DIAGNOSIS — M545 Low back pain: Secondary | ICD-10-CM | POA: Diagnosis not present

## 2019-02-14 DIAGNOSIS — M25652 Stiffness of left hip, not elsewhere classified: Secondary | ICD-10-CM | POA: Diagnosis not present

## 2019-02-14 DIAGNOSIS — R262 Difficulty in walking, not elsewhere classified: Secondary | ICD-10-CM | POA: Diagnosis not present

## 2019-02-14 DIAGNOSIS — M799 Soft tissue disorder, unspecified: Secondary | ICD-10-CM | POA: Diagnosis not present

## 2019-02-14 DIAGNOSIS — E663 Overweight: Secondary | ICD-10-CM | POA: Diagnosis not present

## 2019-02-14 DIAGNOSIS — M25552 Pain in left hip: Secondary | ICD-10-CM | POA: Diagnosis not present

## 2019-02-14 DIAGNOSIS — M545 Low back pain: Secondary | ICD-10-CM | POA: Diagnosis not present

## 2019-02-14 DIAGNOSIS — R293 Abnormal posture: Secondary | ICD-10-CM | POA: Diagnosis not present

## 2019-02-23 DIAGNOSIS — R262 Difficulty in walking, not elsewhere classified: Secondary | ICD-10-CM | POA: Diagnosis not present

## 2019-02-23 DIAGNOSIS — M25552 Pain in left hip: Secondary | ICD-10-CM | POA: Diagnosis not present

## 2019-02-23 DIAGNOSIS — M545 Low back pain: Secondary | ICD-10-CM | POA: Diagnosis not present

## 2019-02-23 DIAGNOSIS — M25652 Stiffness of left hip, not elsewhere classified: Secondary | ICD-10-CM | POA: Diagnosis not present

## 2019-02-23 DIAGNOSIS — M799 Soft tissue disorder, unspecified: Secondary | ICD-10-CM | POA: Diagnosis not present

## 2019-02-23 DIAGNOSIS — R293 Abnormal posture: Secondary | ICD-10-CM | POA: Diagnosis not present

## 2019-02-23 DIAGNOSIS — E663 Overweight: Secondary | ICD-10-CM | POA: Diagnosis not present

## 2019-02-26 DIAGNOSIS — M799 Soft tissue disorder, unspecified: Secondary | ICD-10-CM | POA: Diagnosis not present

## 2019-02-26 DIAGNOSIS — R262 Difficulty in walking, not elsewhere classified: Secondary | ICD-10-CM | POA: Diagnosis not present

## 2019-02-26 DIAGNOSIS — R293 Abnormal posture: Secondary | ICD-10-CM | POA: Diagnosis not present

## 2019-02-26 DIAGNOSIS — M25552 Pain in left hip: Secondary | ICD-10-CM | POA: Diagnosis not present

## 2019-02-26 DIAGNOSIS — M25652 Stiffness of left hip, not elsewhere classified: Secondary | ICD-10-CM | POA: Diagnosis not present

## 2019-02-26 DIAGNOSIS — E663 Overweight: Secondary | ICD-10-CM | POA: Diagnosis not present

## 2019-02-26 DIAGNOSIS — M545 Low back pain: Secondary | ICD-10-CM | POA: Diagnosis not present

## 2019-03-15 DIAGNOSIS — E038 Other specified hypothyroidism: Secondary | ICD-10-CM | POA: Diagnosis not present

## 2019-03-15 DIAGNOSIS — I10 Essential (primary) hypertension: Secondary | ICD-10-CM | POA: Diagnosis not present

## 2019-03-15 DIAGNOSIS — E7800 Pure hypercholesterolemia, unspecified: Secondary | ICD-10-CM | POA: Diagnosis not present

## 2019-03-15 DIAGNOSIS — J45909 Unspecified asthma, uncomplicated: Secondary | ICD-10-CM | POA: Diagnosis not present

## 2019-04-09 DIAGNOSIS — R69 Illness, unspecified: Secondary | ICD-10-CM | POA: Diagnosis not present

## 2019-05-14 DIAGNOSIS — R69 Illness, unspecified: Secondary | ICD-10-CM | POA: Diagnosis not present

## 2019-05-31 DIAGNOSIS — R69 Illness, unspecified: Secondary | ICD-10-CM | POA: Diagnosis not present

## 2019-06-06 DIAGNOSIS — M19072 Primary osteoarthritis, left ankle and foot: Secondary | ICD-10-CM | POA: Diagnosis not present

## 2019-06-07 DIAGNOSIS — E038 Other specified hypothyroidism: Secondary | ICD-10-CM | POA: Diagnosis not present

## 2019-06-07 DIAGNOSIS — J45909 Unspecified asthma, uncomplicated: Secondary | ICD-10-CM | POA: Diagnosis not present

## 2019-06-07 DIAGNOSIS — I10 Essential (primary) hypertension: Secondary | ICD-10-CM | POA: Diagnosis not present

## 2019-06-07 DIAGNOSIS — E7800 Pure hypercholesterolemia, unspecified: Secondary | ICD-10-CM | POA: Diagnosis not present

## 2019-07-18 DIAGNOSIS — M19072 Primary osteoarthritis, left ankle and foot: Secondary | ICD-10-CM | POA: Diagnosis not present

## 2019-08-06 DIAGNOSIS — J45909 Unspecified asthma, uncomplicated: Secondary | ICD-10-CM | POA: Diagnosis not present

## 2019-08-06 DIAGNOSIS — E038 Other specified hypothyroidism: Secondary | ICD-10-CM | POA: Diagnosis not present

## 2019-08-06 DIAGNOSIS — M25559 Pain in unspecified hip: Secondary | ICD-10-CM | POA: Diagnosis not present

## 2019-08-06 DIAGNOSIS — I10 Essential (primary) hypertension: Secondary | ICD-10-CM | POA: Diagnosis not present

## 2019-08-06 DIAGNOSIS — R739 Hyperglycemia, unspecified: Secondary | ICD-10-CM | POA: Diagnosis not present

## 2019-08-06 DIAGNOSIS — R790 Abnormal level of blood mineral: Secondary | ICD-10-CM | POA: Diagnosis not present

## 2019-08-06 DIAGNOSIS — E7800 Pure hypercholesterolemia, unspecified: Secondary | ICD-10-CM | POA: Diagnosis not present

## 2019-09-10 ENCOUNTER — Other Ambulatory Visit: Payer: Self-pay | Admitting: Obstetrics & Gynecology

## 2019-09-10 DIAGNOSIS — Z1231 Encounter for screening mammogram for malignant neoplasm of breast: Secondary | ICD-10-CM

## 2019-09-11 ENCOUNTER — Ambulatory Visit
Admission: RE | Admit: 2019-09-11 | Discharge: 2019-09-11 | Disposition: A | Discharge status: Home / Self Care | Payer: Medicare HMO | Source: Ambulatory Visit | Attending: Obstetrics & Gynecology | Admitting: Obstetrics & Gynecology

## 2019-09-11 ENCOUNTER — Other Ambulatory Visit: Payer: Self-pay

## 2019-09-11 DIAGNOSIS — Z1231 Encounter for screening mammogram for malignant neoplasm of breast: Secondary | ICD-10-CM

## 2019-09-11 DIAGNOSIS — D509 Iron deficiency anemia, unspecified: Secondary | ICD-10-CM | POA: Diagnosis not present

## 2019-09-11 DIAGNOSIS — I10 Essential (primary) hypertension: Secondary | ICD-10-CM | POA: Diagnosis not present

## 2019-09-11 DIAGNOSIS — J45909 Unspecified asthma, uncomplicated: Secondary | ICD-10-CM | POA: Diagnosis not present

## 2019-09-11 DIAGNOSIS — E038 Other specified hypothyroidism: Secondary | ICD-10-CM | POA: Diagnosis not present

## 2019-09-11 DIAGNOSIS — E7800 Pure hypercholesterolemia, unspecified: Secondary | ICD-10-CM | POA: Diagnosis not present

## 2019-10-04 DIAGNOSIS — E7800 Pure hypercholesterolemia, unspecified: Secondary | ICD-10-CM | POA: Diagnosis not present

## 2019-10-04 DIAGNOSIS — D509 Iron deficiency anemia, unspecified: Secondary | ICD-10-CM | POA: Diagnosis not present

## 2019-10-15 DIAGNOSIS — R69 Illness, unspecified: Secondary | ICD-10-CM | POA: Diagnosis not present

## 2019-11-13 ENCOUNTER — Telehealth: Payer: Self-pay | Admitting: Family

## 2019-11-13 NOTE — Telephone Encounter (Signed)
Patient advised of Appointments scheduled.  New Patient packet with calendar/letter & mailed 

## 2019-11-15 ENCOUNTER — Other Ambulatory Visit: Payer: Self-pay | Admitting: Adult Health

## 2019-11-15 ENCOUNTER — Telehealth: Payer: Self-pay | Admitting: Internal Medicine

## 2019-11-15 ENCOUNTER — Other Ambulatory Visit: Payer: Self-pay

## 2019-11-15 ENCOUNTER — Encounter: Payer: Self-pay | Admitting: Adult Health

## 2019-11-15 ENCOUNTER — Ambulatory Visit: Payer: Medicare HMO | Admitting: Adult Health

## 2019-11-15 VITALS — BP 122/80 | HR 98 | Temp 98.2°F | Ht 64.0 in | Wt 170.0 lb

## 2019-11-15 DIAGNOSIS — J45991 Cough variant asthma: Secondary | ICD-10-CM

## 2019-11-15 DIAGNOSIS — J069 Acute upper respiratory infection, unspecified: Secondary | ICD-10-CM | POA: Diagnosis not present

## 2019-11-15 DIAGNOSIS — Z1152 Encounter for screening for COVID-19: Secondary | ICD-10-CM | POA: Diagnosis not present

## 2019-11-15 LAB — STREP COMPLETE PANEL
Strep Pyogenes: NEGATIVE
Strep dysgalactiae: NEGATIVE

## 2019-11-15 MED ORDER — PREDNISONE 10 MG PO TABS: ORAL_TABLET | ORAL | 0 refills | Status: DC

## 2019-11-15 MED ORDER — AZITHROMYCIN 250 MG PO TABS: ORAL_TABLET | ORAL | 0 refills | Status: AC

## 2019-11-15 NOTE — Addendum Note (Signed)
Addended by: Suzzanne Cloud E on: 11/15/2019 02:06 PM   Modules accepted: Orders

## 2019-11-15 NOTE — Telephone Encounter (Signed)
Spoke with pt  She states feels like she is getting bronchitis  She c/o cough, chest burning and chest congestion  Has had covid vaccine  No fever  OV with TP at 12 today

## 2019-11-15 NOTE — Addendum Note (Signed)
Addended by: Suzzanne Cloud E on: 11/15/2019 02:13 PM   Modules accepted: Orders

## 2019-11-15 NOTE — Addendum Note (Signed)
Addended by: Suzzanne Cloud E on: 11/15/2019 02:10 PM   Modules accepted: Orders

## 2019-11-15 NOTE — Assessment & Plan Note (Signed)
Flare with URI/acute pharyngitis.  Check strep test.  Since patient has acute respiratory symptoms.  She has been vaccinated.  Will check COVID-19 test as she is to travel soon.  Plan  Patient Instructions  Warm Salt water gargles.  Zpack to have on hold if symptoms worsen or do not improve with discolored mucus .  Prednisone taper over next week .  Covid 19 testing  Strep test today .  Restart Dulera 2 puffs Twice daily , rinse after use.  Albuterol As needed   Follow up with Dr. Melvyn Novas  In 3 months and As needed   Please contact office for sooner follow up if symptoms do not improve or worsen or seek emergency care

## 2019-11-15 NOTE — Progress Notes (Signed)
@Patient  ID: Leslie Hines, female    DOB: 1951/03/23, 69 y.o.   MRN: [id number]  Chief Complaint  Patient presents with  . Acute Visit    Asthma     Referring provider: Lenetta Quaker  HPI: 69 year old female never smoker seen for initial pulmonary consult Sep 08, 2018 for cough variant asthma   TEST/EVENTS :  Chest x-ray May 2020 showed clear lungs.  Moderate to large hiatal hernia   11/15/2019 Acute OV : Cough   Patient presents for an acute office visit.  Last seen May 2020.  Patient complains of 4 days of sore throat, drainage, cough and congestion and wheezing.  Patient says every time she gets something like this it turns into a bronchitis and she wants to had it off before he gets started.  She was last seen May 2020.  She was changed from Advair to [facility].  Patient says since then she has been doing very well she has had no flare of her asthma symptoms.  She says she uses Dulera on occasion but has felt much better since getting off of Advair. She did restart Dulera on a consistent basis over the last couple days.  Denies any increased albuterol use. She denies any fever, loss of taste or smell.  She received both Covid vaccines in March of this year. She denies any known sick contacts.  She is planning to travel to see her daughter in [place] in 2 weeks.  Allergies  Allergen Reactions  . Lisinopril Nausea Only and Other (See Comments)    "Dizziness"  . Sulfur Hives  . Macrobid [Nitrofurantoin Macrocrystal] Nausea Only    Immunization History  Administered Date(s) Administered  . PFIZER SARS-COV-2 Vaccination 06/09/2019, 07/04/2019    Past Medical History:  Diagnosis Date  . Anxiety   . Arthritis    lt shoulder  . Asthma    allergy related  . Breast mass, left   . Environmental allergies   . Hypertension   . Hypothyroidism     Tobacco History: Social History   Tobacco Use  Smoking Status Never Smoker  Smokeless Tobacco Never Used   Counseling  given: Not Answered   Outpatient Medications Prior to Visit  Medication Sig Dispense Refill  . albuterol (PROVENTIL HFA;VENTOLIN HFA) 108 (90 Base) MCG/ACT inhaler Inhale 1-2 puffs into the lungs every 6 (six) hours as needed for wheezing or shortness of breath.     . ALPRAZolam (XANAX) 0.5 MG tablet Take 0.5 mg by mouth daily as needed for anxiety.     Marland Kitchen estradiol (ESTRACE) 2 MG tablet Take 1 mg by mouth daily. 1/2 tablet daily    . hydrochlorothiazide (HYDRODIURIL) 25 MG tablet Take 1 tablet (25 mg total) by mouth daily. NEED OV for further refills. 15 tablet 0  . levothyroxine (SYNTHROID) 88 MCG tablet Take 88 mcg by mouth every morning.    . magnesium 30 MG tablet Take 30 mg by mouth daily as needed.     . mometasone-formoterol (DULERA) 100-5 MCG/ACT AERO Inhale 2 puffs into the lungs 2 (two) times a day. 1 Inhaler 11  . montelukast (SINGULAIR) 10 MG tablet Take 10 mg by mouth daily.     Marland Kitchen POTASSIUM PO Take 1 tablet by mouth daily.    . pravastatin (PRAVACHOL) 40 MG tablet Take 40 mg by mouth at bedtime.    Marland Kitchen zolpidem (AMBIEN) 5 MG tablet Take 5 mg by mouth at bedtime as needed for sleep.    Marland Kitchen  levothyroxine (SYNTHROID, LEVOTHROID) 75 MCG tablet Take 75 mcg by mouth daily before breakfast.     No facility-administered medications prior to visit.     Review of Systems:   Constitutional:   No  weight loss, night sweats,  Fevers, chills, fatigue, or  lassitude.  HEENT:   No headaches,  Difficulty swallowing,  Tooth/dental problems, or  +sore throat,                No sneezing, itching, ear ache, nasal congestion, post nasal drip,   CV:  No chest pain,  Orthopnea, PND, swelling in lower extremities, anasarca, dizziness, palpitations, syncope.   GI  No heartburn, indigestion, abdominal pain, nausea, vomiting, diarrhea, change in bowel habits, loss of appetite, bloody stools.   Resp:  No chest wall deformity  Skin: no rash or lesions.  GU: no dysuria, change in color of urine, no  urgency or frequency.  No flank pain, no hematuria   MS:  No joint pain or swelling.  No decreased range of motion.  No back pain.    Physical Exam  BP 122/80 (BP Location: Left Arm, Cuff Size: Normal)   Pulse 98   Temp 98.2 F (36.8 C) (Oral)   Ht 5\' 4"  (1.626 m)   Wt 170 lb (77.1 kg)   SpO2 100% Comment: on RA  BMI 29.18 kg/m   GEN: A/Ox3; pleasant , NAD, well nourished    HEENT:  [place]et/AT,   NOSE-clear, THROAT-clear, no lesions, no postnasal drip or exudate noted.  Posterior pharynx with mild redness  NECK:  Supple w/ fair ROM; no JVD; normal carotid impulses w/o bruits; no thyromegaly or nodules palpated; no lymphadenopathy.    RESP  Clear  P & A; w/o, wheezes/ rales/ or rhonchi. no accessory muscle use, no dullness to percussion  CARD:  RRR, no m/r/g, no peripheral edema, pulses intact, no cyanosis or clubbing.  GI:   Soft & nt; nml bowel sounds; no organomegaly or masses detected.   Musco: Warm bil, no deformities or joint swelling noted.   Neuro: alert, no focal deficits noted.    Skin: Warm, no lesions or rashes    Lab Results:   BNP No results found for: BNP  ProBNP No results found for: PROBNP  Imaging: No results found.    No flowsheet data found.  No results found for: NITRICOXIDE      Assessment & Plan:   Cough variant asthma Flare with URI/acute pharyngitis.  Check strep test.  Since patient has acute respiratory symptoms.  She has been vaccinated.  Will check COVID-19 test as she is to travel soon.  Plan  Patient Instructions  Warm Salt water gargles.  Zpack to have on hold if symptoms worsen or do not improve with discolored mucus .  Prednisone taper over next week .  Covid 19 testing  Strep test today .  Restart Dulera 2 puffs Twice daily , rinse after use.  Albuterol As needed   Follow up with Dr. Melvyn Novas  In 3 months and As needed   Please contact office for sooner follow up if symptoms do not improve or worsen or seek emergency  care            Rexene Edison, NP 11/15/2019

## 2019-11-15 NOTE — Patient Instructions (Addendum)
Warm Salt water gargles.  Zpack to have on hold if symptoms worsen or do not improve with discolored mucus .  Prednisone taper over next week .  Covid 19 testing  Strep test today .  Restart Dulera 2 puffs Twice daily , rinse after use.  Albuterol As needed   Follow up with Dr. Melvyn Novas  In 3 months and As needed   Please contact office for sooner follow up if symptoms do not improve or worsen or seek emergency care

## 2019-12-06 ENCOUNTER — Emergency Department (HOSPITAL_COMMUNITY): Payer: Medicare HMO

## 2019-12-06 ENCOUNTER — Encounter (HOSPITAL_COMMUNITY): Payer: Self-pay

## 2019-12-06 ENCOUNTER — Other Ambulatory Visit: Payer: Self-pay

## 2019-12-06 ENCOUNTER — Emergency Department (HOSPITAL_COMMUNITY)
Admission: EM | Admit: 2019-12-06 | Discharge: 2019-12-06 | Disposition: A | Discharge status: Home / Self Care | Payer: Medicare HMO | Attending: Emergency Medicine | Admitting: Emergency Medicine

## 2019-12-06 DIAGNOSIS — Z79890 Hormone replacement therapy (postmenopausal): Secondary | ICD-10-CM | POA: Insufficient documentation

## 2019-12-06 DIAGNOSIS — K30 Functional dyspepsia: Secondary | ICD-10-CM | POA: Insufficient documentation

## 2019-12-06 DIAGNOSIS — I10 Essential (primary) hypertension: Secondary | ICD-10-CM | POA: Diagnosis not present

## 2019-12-06 DIAGNOSIS — K449 Diaphragmatic hernia without obstruction or gangrene: Secondary | ICD-10-CM | POA: Diagnosis not present

## 2019-12-06 DIAGNOSIS — Z79899 Other long term (current) drug therapy: Secondary | ICD-10-CM | POA: Insufficient documentation

## 2019-12-06 DIAGNOSIS — E039 Hypothyroidism, unspecified: Secondary | ICD-10-CM | POA: Insufficient documentation

## 2019-12-06 DIAGNOSIS — R079 Chest pain, unspecified: Secondary | ICD-10-CM | POA: Diagnosis not present

## 2019-12-06 DIAGNOSIS — J45909 Unspecified asthma, uncomplicated: Secondary | ICD-10-CM | POA: Insufficient documentation

## 2019-12-06 DIAGNOSIS — Z7982 Long term (current) use of aspirin: Secondary | ICD-10-CM | POA: Diagnosis not present

## 2019-12-06 DIAGNOSIS — R1013 Epigastric pain: Secondary | ICD-10-CM

## 2019-12-06 DIAGNOSIS — M549 Dorsalgia, unspecified: Secondary | ICD-10-CM | POA: Insufficient documentation

## 2019-12-06 LAB — CBC
HCT: 44.6 % (ref 36.0–46.0)
Hemoglobin: 14 g/dL (ref 12.0–15.0)
MCH: 28.6 pg (ref 26.0–34.0)
MCHC: 31.4 g/dL (ref 30.0–36.0)
MCV: 91.2 fL (ref 80.0–100.0)
Platelets: 343 10*3/uL (ref 150–400)
RBC: 4.89 MIL/uL (ref 3.87–5.11)
RDW: 14.1 % (ref 11.5–15.5)
WBC: 8.1 10*3/uL (ref 4.0–10.5)
nRBC: 0 % (ref 0.0–0.2)

## 2019-12-06 LAB — BASIC METABOLIC PANEL
Anion gap: 12 (ref 5–15)
BUN: 13 mg/dL (ref 8–23)
CO2: 22 mmol/L (ref 22–32)
Calcium: 8.7 mg/dL — ABNORMAL LOW (ref 8.9–10.3)
Chloride: 103 mmol/L (ref 98–111)
Creatinine, Ser: 0.91 mg/dL (ref 0.44–1.00)
GFR calc Af Amer: >60 mL/min (ref >60)
GFR calc non Af Amer: >60 mL/min (ref >60)
Glucose, Bld: 102 mg/dL — ABNORMAL HIGH (ref 70–99)
Potassium: 4.2 mmol/L (ref 3.5–5.1)
Sodium: 137 mmol/L (ref 135–145)

## 2019-12-06 LAB — TROPONIN I (HIGH SENSITIVITY)
Troponin I (High Sensitivity): 3 ng/L (ref <18)
Troponin I (High Sensitivity): 4 ng/L (ref <18)

## 2019-12-06 MED ORDER — PANTOPRAZOLE SODIUM 20 MG PO TBEC: 20.0000 mg | DELAYED_RELEASE_TABLET | Freq: Every day | ORAL | 0 refills | Status: DC

## 2019-12-06 NOTE — Discharge Instructions (Addendum)
Please take 1 tablet Protonix once a day and please follow-up with your primary care provider if your symptoms continue.  Please return the emergency department if you have worsening chest pain or shortness of breath.

## 2019-12-06 NOTE — ED Triage Notes (Signed)
Pt arrives to ED w/ c/o 5/10 centrally located chest pain radiating to the back that started at 0330 today. 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 Pt endorses SOB, denies n/v. Resp e/u, NAD.

## 2019-12-06 NOTE — ED Provider Notes (Signed)
[facility] EMERGENCY DEPARTMENT Provider Note   CSN: [id number] Arrival date & time: 12/06/19  1138     History Chief Complaint  Patient presents with  . Chest Pain    Leslie Hines is a 69 y.o. female.  The history is provided by the patient.  Chest Pain Pain location:  L chest Pain quality: tightness   Pain radiates to:  Does not radiate Pain severity:  No pain Onset quality:  Sudden Duration:  4 hours Timing:  Sporadic Progression:  Resolved Chronicity:  Recurrent Context comment:  Sleeping. Relieved by:  Nothing Worsened by:  Nothing Ineffective treatments:  None tried Associated symptoms: back pain, diaphoresis and nausea   Associated symptoms: no abdominal pain, no cough, no fever, no palpitations, no shortness of breath and no vomiting   Risk factors: high cholesterol and hypertension     HPI: A 69 year old patient with a history of hypertension and hypercholesterolemia presents for evaluation of chest pain. Initial onset of pain was more than 6 hours ago. The patient's chest pain is well-localized, is described as heaviness/pressure/tightness and is not worse with exertion. The patient complains of nausea and reports some diaphoresis. The patient's chest pain is not middle- or left-sided, is not sharp and does not radiate to the arms/jaw/neck. The patient has no history of stroke, has no history of peripheral artery disease, has not smoked in the past 90 days, denies any history of treated diabetes, has no relevant family history of coronary artery disease (first degree relative at less than age 7) and does not have an elevated BMI (>=30).   Past Medical History:  Diagnosis Date  . Anxiety   . Arthritis    lt shoulder  . Asthma    allergy related  . Breast mass, left   . Environmental allergies   . Hypertension   . Hypothyroidism     Patient Active Problem List   Diagnosis Date Noted  . Cough variant asthma 09/08/2018  . Essential  hypertension 12/23/2015  . Abnormal EKG 10/21/2015  . Chest pain 10/21/2015    Past Surgical History:  Procedure Laterality Date  . ABDOMINAL HYSTERECTOMY    . BREAST EXCISIONAL BIOPSY Left 12/2015  . BREAST LUMPECTOMY Left 12/31/2015   Procedure: LEFT BREAST LUMPECTOMY;  Surgeon: Coralie Keens, MD;  Location: [facility];  Service: General;  Laterality: Left;  . CESAREAN SECTION     x2  . COLONOSCOPY W/ POLYPECTOMY    . LAPAROSCOPY    . SHOULDER ARTHROSCOPY Left      OB History   No obstetric history on file.     No family history on file.  Social History   Tobacco Use  . Smoking status: Never Smoker  . Smokeless tobacco: Never Used  Substance Use Topics  . Alcohol use: Yes    Comment: social  . Drug use: No    Home Medications Prior to Admission medications   Medication Sig Start Date End Date Taking? Authorizing Provider  albuterol (PROVENTIL HFA;VENTOLIN HFA) 108 (90 Base) MCG/ACT inhaler Inhale 1-2 puffs into the lungs every 6 (six) hours as needed for wheezing or shortness of breath.    Yes [provider]  ALPRAZolam Duanne Moron) 0.5 MG tablet Take 0.5 mg by mouth daily as needed for anxiety.    Yes [provider]  aspirin EC 81 MG tablet Take 162 mg by mouth once as needed (for onset of chest pain). Swallow whole.   Yes [provider]  estradiol (  ESTRACE) 2 MG tablet Take 1 mg by mouth daily.    Yes [provider]  ferrous sulfate 325 (65 FE) MG tablet Take 325 mg by mouth daily with breakfast.   Yes [provider]  hydrochlorothiazide (HYDRODIURIL) 25 MG tablet Take 1 tablet (25 mg total) by mouth daily. NEED OV for further refills. Patient taking differently: Take 25 mg by mouth daily.  11/30/17  Yes Lorretta Harp, MD  levothyroxine (SYNTHROID) 88 MCG tablet Take 88 mcg by mouth daily before breakfast.  09/13/19  Yes [provider]  magnesium 30 MG tablet Take 30 mg by mouth daily as needed (for leg cramps).     Yes [provider]  mometasone-formoterol (DULERA) 100-5 MCG/ACT AERO Inhale 2 puffs into the lungs 2 (two) times a day. Patient taking differently: Inhale 2 puffs into the lungs 2 (two) times daily as needed (for flares).  09/08/18  Yes Tanda Rockers, MD  montelukast (SINGULAIR) 10 MG tablet Take 10 mg by mouth daily.    Yes [provider]  POTASSIUM PO Take 1 tablet by mouth daily as needed (for leg cramps).    Yes [provider]  pravastatin (PRAVACHOL) 40 MG tablet Take 40 mg by mouth at bedtime. 10/21/19  Yes [provider]  zolpidem (AMBIEN) 10 MG tablet Take 5-10 mg by mouth at bedtime.   Yes [provider]  pantoprazole (PROTONIX) 20 MG tablet Take 1 tablet (20 mg total) by mouth daily. 12/06/19   Silvestre Gunner, MD  predniSONE (DELTASONE) 10 MG tablet 4 tabs for 2 days, then 3 tabs for 2 days, 2 tabs for 2 days, then 1 tab for 2 days, then stop Patient not taking: Reported on 12/06/2019 11/15/19   Parrett, Fonnie Mu, NP  zolpidem (AMBIEN) 5 MG tablet Take 5 mg by mouth at bedtime as needed for sleep. Patient not taking: Reported on 12/06/2019    [provider]    Allergies    Macrobid [nitrofurantoin macrocrystal], Lisinopril, and Sulfa antibiotics  Review of Systems   Review of Systems  Constitutional: Positive for diaphoresis. Negative for chills and fever.  HENT: Negative for ear pain and sore throat.   Eyes: Negative for pain and visual disturbance.  Respiratory: Negative for cough and shortness of breath.   Cardiovascular: Positive for chest pain. Negative for palpitations.  Gastrointestinal: Positive for nausea. Negative for abdominal pain and vomiting.  Genitourinary: Negative for dysuria and hematuria.  Musculoskeletal: Positive for back pain. Negative for arthralgias.  Skin: Negative for color change and rash.  Neurological: Negative for seizures and syncope.  All other systems reviewed and are  negative.   Physical Exam Updated Vital Signs BP (!) 130/98   Pulse 90   Temp 98.6 F (37 C) (Oral)   Resp 20   Ht 5\' 4"  (1.626 m)   Wt 77.1 kg   SpO2 97%   BMI 29.18 kg/m   Physical Exam Vitals and nursing note reviewed.  Constitutional:      General: She is not in acute distress.    Appearance: She is well-developed.  HENT:     Head: Normocephalic and atraumatic.  Eyes:     Extraocular Movements: Extraocular movements intact.     Conjunctiva/sclera: Conjunctivae normal.     Pupils: Pupils are equal, round, and reactive to light.  Cardiovascular:     Rate and Rhythm: Normal rate and regular rhythm.     Heart sounds: Normal heart sounds. Heart sounds not distant.  No murmur heard.   Pulmonary:     Effort: Pulmonary effort is normal. No respiratory distress.     Breath sounds: Normal breath sounds. No decreased breath sounds, wheezing, rhonchi or rales.  Abdominal:     Palpations: Abdomen is soft.     Tenderness: There is no abdominal tenderness.  Musculoskeletal:     Cervical back: Neck supple.     Right lower leg: No tenderness. No edema.     Left lower leg: No tenderness. No edema.  Skin:    General: Skin is warm and dry.     Capillary Refill: Capillary refill takes less than 2 seconds.  Neurological:     General: No focal deficit present.     Mental Status: She is alert and oriented to person, place, and time.     Cranial Nerves: No cranial nerve deficit.     Motor: No weakness.  Psychiatric:        Mood and Affect: Mood normal.        Behavior: Behavior normal.     ED Results / Procedures / Treatments   Labs (all labs ordered are listed, but only abnormal results are displayed) Labs Reviewed  BASIC METABOLIC PANEL - Abnormal; Notable for the following components:      Result Value   Glucose, Bld 102 (*)    Calcium 8.7 (*)    All other components within normal limits  CBC  TROPONIN I (HIGH SENSITIVITY)  TROPONIN I (HIGH SENSITIVITY)    EKG EKG  Interpretation  Date/Time:  Thursday December 06 2019 11:40:39 EDT Ventricular Rate:  83 PR Interval:  140 QRS Duration: 86 QT Interval:  432 QTC Calculation: 507 R Axis:   -66 Text Interpretation: Normal sinus rhythm Low voltage QRS Left anterior fascicular block Possible Lateral infarct , age undetermined Abnormal ECG No STEMI Confirmed by Octaviano Glow [phone]) on 12/06/2019 6:08:00 PM   Radiology DG Chest 2 View  Result Date: 12/06/2019 CLINICAL DATA:  Chest pain EXAM: CHEST - 2 VIEW COMPARISON:  09/08/2018 FINDINGS: Cardiac shadow is stable. Large hiatal hernia is again seen lungs are clear. No sizable effusion is noted. No bony abnormality is noted. IMPRESSION: Large hiatal hernia stable from the prior exam. Electronically Signed   By: Inez Catalina M.D.   On: 12/06/2019 12:31    Procedures Procedures (including critical care time)  Medications Ordered in ED Medications - No data to display  ED Course  I have reviewed the triage vital signs and the nursing notes.  Pertinent labs & imaging results that were available during my care of the patient were reviewed by me and considered in my medical decision making (see chart for details).    MDM Rules/Calculators/A&P HEAR Score: 52                        Six 20-year-old female with history of hypertension hyperlipidemia presents with chest pain.  Patient states that she woke up at 3 AM last night with chest tightness associated with mild diaphoresis and nausea.  Patient states the pain resolved around 730 this morning and has not recurred.  She also endorses low back pain that started around the same time that has also since resolved.  Patient states that she has had multiple episodes of this in the past which have occurred at night.  Patient try to call her cardiologist today however they stated that she needed to go through her PCP before she was seen  by them.  Patient an EKG done at PCP that was "abnormal" which prompted her [facility]  [facility].  Patient is currently denying any chest pain, shortness of breath, fever, chills, cough, back pain, abdominal pain, nausea, vomiting, diaphoresis.  Afebrile vital signs stable.  Exam as above.  No focal abnormalities on physical exam.  Patient is currently chest and back pain-free.  Initial troponin 3 and repeat troponin 4.  BMP, CBC unremarkable.  Chest x-ray showed no acute cardiopulmonary abnormality.  EKG notable for left bundle branch block which does not with previous EKGs.  Hear score 5.  Low concern for ACS given resolution of chest pain, stable EKG, negative troponins x2.  Low concern for dissection of this patient despite her chest and back pain given her normal vital signs as well as lack of chest and back pain for multiple hours.  Chest x-ray did not show any evidence of pneumonia, pneumothorax.  Chest x-ray did however show a large hiatal hernia that was seen on previous x-rays.  I disclosed this to the patient however she was not aware that she had a hiatal hernia.  Patient symptoms may be secondary to heartburn given that she has had multiple episodes of this in the middle of the night and has a hernia.  Given this I will treat patient with Protonix and have her follow-up with her primary care physician.  I shared decision making with the patient about her being admitted for chest pain versus outpatient work-up and she elected for outpatient work-up which I feel is reasonable at this time.  Discussed strict ED return precautions with the patient who voiced agreement and understanding of the overall plan.  Patient was discharged in stable condition without further events.    Final Clinical Impression(s) / ED Diagnoses Final diagnoses:  Chest pain, unspecified type  Dyspepsia    Rx / DC Orders ED Discharge Orders         Ordered    pantoprazole (PROTONIX) 20 MG tablet  Daily     Discontinue  Reprint     12/06/19 2215           Silvestre Gunner,  MD 12/06/19 2223    Tegeler, Gwenyth Allegra, MD 12/06/19 2348

## 2019-12-10 ENCOUNTER — Other Ambulatory Visit: Payer: Self-pay | Admitting: Family

## 2019-12-10 DIAGNOSIS — D649 Anemia, unspecified: Secondary | ICD-10-CM

## 2019-12-11 ENCOUNTER — Encounter: Payer: Self-pay | Admitting: Family

## 2019-12-11 ENCOUNTER — Inpatient Hospital Stay: Payer: Medicare HMO | Attending: Hematology & Oncology

## 2019-12-11 ENCOUNTER — Inpatient Hospital Stay (HOSPITAL_BASED_OUTPATIENT_CLINIC_OR_DEPARTMENT_OTHER): Payer: Medicare HMO | Admitting: Family

## 2019-12-11 ENCOUNTER — Other Ambulatory Visit: Payer: Self-pay

## 2019-12-11 VITALS — BP 121/84 | HR 83 | Temp 97.6°F | Resp 19 | Ht 64.0 in | Wt 175.1 lb

## 2019-12-11 DIAGNOSIS — Z79899 Other long term (current) drug therapy: Secondary | ICD-10-CM | POA: Insufficient documentation

## 2019-12-11 DIAGNOSIS — E039 Hypothyroidism, unspecified: Secondary | ICD-10-CM | POA: Insufficient documentation

## 2019-12-11 DIAGNOSIS — D508 Other iron deficiency anemias: Secondary | ICD-10-CM

## 2019-12-11 DIAGNOSIS — K5900 Constipation, unspecified: Secondary | ICD-10-CM | POA: Insufficient documentation

## 2019-12-11 DIAGNOSIS — Z7982 Long term (current) use of aspirin: Secondary | ICD-10-CM | POA: Insufficient documentation

## 2019-12-11 DIAGNOSIS — D509 Iron deficiency anemia, unspecified: Secondary | ICD-10-CM | POA: Diagnosis present

## 2019-12-11 DIAGNOSIS — I10 Essential (primary) hypertension: Secondary | ICD-10-CM | POA: Diagnosis not present

## 2019-12-11 DIAGNOSIS — D649 Anemia, unspecified: Secondary | ICD-10-CM

## 2019-12-11 DIAGNOSIS — Z90710 Acquired absence of both cervix and uterus: Secondary | ICD-10-CM | POA: Insufficient documentation

## 2019-12-11 LAB — CBC WITH DIFFERENTIAL (CANCER CENTER ONLY)
Abs Immature Granulocytes: 0.14 10*3/uL — ABNORMAL HIGH (ref 0.00–0.07)
Basophils Absolute: 0.1 10*3/uL (ref 0.0–0.1)
Basophils Relative: 1 %
Eosinophils Absolute: 0.3 10*3/uL (ref 0.0–0.5)
Eosinophils Relative: 4 %
HCT: 40.5 % (ref 36.0–46.0)
Hemoglobin: 12.9 g/dL (ref 12.0–15.0)
Immature Granulocytes: 2 %
Lymphocytes Relative: 22 %
Lymphs Abs: 1.8 10*3/uL (ref 0.7–4.0)
MCH: 28.4 pg (ref 26.0–34.0)
MCHC: 31.9 g/dL (ref 30.0–36.0)
MCV: 89 fL (ref 80.0–100.0)
Monocytes Absolute: 1 10*3/uL (ref 0.1–1.0)
Monocytes Relative: 12 %
Neutro Abs: 4.9 10*3/uL (ref 1.7–7.7)
Neutrophils Relative %: 59 %
Platelet Count: 344 10*3/uL (ref 150–400)
RBC: 4.55 MIL/uL (ref 3.87–5.11)
RDW: 13.7 % (ref 11.5–15.5)
WBC Count: 8.3 10*3/uL (ref 4.0–10.5)
nRBC: 0 % (ref 0.0–0.2)

## 2019-12-11 LAB — CMP (CANCER CENTER ONLY)
ALT: 8 U/L (ref 0–44)
AST: 12 U/L — ABNORMAL LOW (ref 15–41)
Albumin: 3.9 g/dL (ref 3.5–5.0)
Alkaline Phosphatase: 75 U/L (ref 38–126)
Anion gap: 8 (ref 5–15)
BUN: 21 mg/dL (ref 8–23)
CO2: 30 mmol/L (ref 22–32)
Calcium: 9.3 mg/dL (ref 8.9–10.3)
Chloride: 100 mmol/L (ref 98–111)
Creatinine: 1 mg/dL (ref 0.44–1.00)
GFR, Est AFR Am: >60 mL/min (ref >60)
GFR, Estimated: 59 mL/min — ABNORMAL LOW (ref >60)
Glucose, Bld: 87 mg/dL (ref 70–99)
Potassium: 4 mmol/L (ref 3.5–5.1)
Sodium: 138 mmol/L (ref 135–145)
Total Bilirubin: 0.3 mg/dL (ref 0.3–1.2)
Total Protein: 6.5 g/dL (ref 6.5–8.1)

## 2019-12-11 LAB — SAVE SMEAR(SSMR), FOR PROVIDER SLIDE REVIEW

## 2019-12-11 LAB — RETICULOCYTES
Immature Retic Fract: 19.7 % — ABNORMAL HIGH (ref 2.3–15.9)
RBC.: 4.52 MIL/uL (ref 3.87–5.11)
Retic Count, Absolute: 92.7 10*3/uL (ref 19.0–186.0)
Retic Ct Pct: 2.1 % (ref 0.4–3.1)

## 2019-12-11 NOTE — Progress Notes (Signed)
Hematology/Oncology Consultation   Name: Leslie Hines      MRN: [id number]    Location: Room/bed info not found  Date: 12/11/2019 Time:3:21 PM   REFERRING PHYSICIAN: Marda Stalker, PA-C  REASON FOR CONSULT: Low iron    DIAGNOSIS: Iron deficiency anemia   HISTORY OF PRESENT ILLNESS: Leslie Hines is a very pleasant 69 yo female with recent diagnosis of iron deficiency.  July lab work: ferritin 13.9, iron saturation 13%, Hgb 11,12, MCV 81, platelets 401, WBC count 7.8.  She started taking an oral iron supplement daily at that time. Hgb is now 12.9, MCV 89, platelets 344, WBC count 8.3. Iron studies are pending.  She is symptomatic with fatigue and SOB with exertion (his history of asthma). She states that she is typically quite active but the fatigue has slowed her down.  She has not noted any blood loss. No abnormal bruising or petechiae.  She has history of chest pain and bundle branch block. She was recently in the ED with chest pain and work up was negative. She plans to follow-up with her cardiologist from the past Dr. Gwenlyn Found.  She started Protonix 2 days ago for GERD. She does have a hiatal hernia.  She states that she sees gastroenterologist Dr. Earlean Shawl. She states that she had polyps removed on her first colonoscopy and goes every 5 years.  No family history of anemia.  No personal or familial history of cancer.  Her father who was 34 1/2 years old just passed away. She also helps care for her mother who has alzheimer's.  She has had a complete hysterectomy and is currently on estradiol.  No fever, chills, n/v, cough, rash, dizziness, palpitations, abdominal pain or changes in bowel or bladder habits.  She has mild constipation at times.  No swelling, tenderness, numbness or tingling in her extremities at this time. She has intermittent numbness in her left hand.  She has occasional puffiness in her feet and ankles. This comes and goes. She takes HCTZ daily.  No falls or syncope.  She  states that she has a good appetite and is staying well hydrated. Her weight is stable.  She does not smoke. She does drink 1/2 bottle of wine 5 nights a week.   ROS: All other 10 point review of systems is negative.   PAST MEDICAL HISTORY:   Past Medical History:  Diagnosis Date  . Anxiety   . Arthritis    lt shoulder  . Asthma    allergy related  . Breast mass, left   . Environmental allergies   . Hypertension   . Hypothyroidism     ALLERGIES: Allergies  Allergen Reactions  . Macrobid [Nitrofurantoin Macrocrystal] Nausea Only and Other (See Comments)    "Made me deathly sick"  . Lisinopril Nausea Only and Other (See Comments)    "Dizziness," also  . Sulfa Antibiotics Hives      MEDICATIONS:  Current Outpatient Medications on File Prior to Visit  Medication Sig Dispense Refill  . albuterol (PROVENTIL HFA;VENTOLIN HFA) 108 (90 Base) MCG/ACT inhaler Inhale 1-2 puffs into the lungs every 6 (six) hours as needed for wheezing or shortness of breath.     . ALPRAZolam (XANAX) 0.5 MG tablet Take 0.5 mg by mouth daily as needed for anxiety.     Marland Kitchen aspirin EC 81 MG tablet Take 162 mg by mouth once as needed (for onset of chest pain). Swallow whole.    . estradiol (ESTRACE) 2 MG tablet Take 1 mg  by mouth daily.     . ferrous sulfate 325 (65 FE) MG tablet Take 325 mg by mouth daily with breakfast.    . hydrochlorothiazide (HYDRODIURIL) 25 MG tablet Take 1 tablet (25 mg total) by mouth daily. NEED OV for further refills. (Patient taking differently: Take 25 mg by mouth daily. ) 15 tablet 0  . levothyroxine (SYNTHROID) 88 MCG tablet Take 88 mcg by mouth daily before breakfast.     . magnesium 30 MG tablet Take 30 mg by mouth daily as needed (for leg cramps).     . mometasone-formoterol (DULERA) 100-5 MCG/ACT AERO Inhale 2 puffs into the lungs 2 (two) times a day. (Patient taking differently: Inhale 2 puffs into the lungs 2 (two) times daily as needed (for flares). ) 1 Inhaler 11  .  montelukast (SINGULAIR) 10 MG tablet Take 10 mg by mouth daily.     . pantoprazole (PROTONIX) 20 MG tablet Take 1 tablet (20 mg total) by mouth daily. 30 tablet 0  . POTASSIUM PO Take 1 tablet by mouth daily as needed (for leg cramps).     . pravastatin (PRAVACHOL) 40 MG tablet Take 40 mg by mouth at bedtime.    . predniSONE (DELTASONE) 10 MG tablet 4 tabs for 2 days, then 3 tabs for 2 days, 2 tabs for 2 days, then 1 tab for 2 days, then stop (Patient not taking: Reported on 12/06/2019) 20 tablet 0  . zolpidem (AMBIEN) 10 MG tablet Take 5-10 mg by mouth at bedtime.    Marland Kitchen zolpidem (AMBIEN) 5 MG tablet Take 5 mg by mouth at bedtime as needed for sleep. (Patient not taking: Reported on 12/06/2019)     No current facility-administered medications on file prior to visit.     PAST SURGICAL HISTORY Past Surgical History:  Procedure Laterality Date  . ABDOMINAL HYSTERECTOMY    . BREAST EXCISIONAL BIOPSY Left 12/2015  . BREAST LUMPECTOMY Left 12/31/2015   Procedure: LEFT BREAST LUMPECTOMY;  Surgeon: Coralie Keens, MD;  Location: [facility];  Service: General;  Laterality: Left;  . CESAREAN SECTION     x2  . COLONOSCOPY W/ POLYPECTOMY    . LAPAROSCOPY    . SHOULDER ARTHROSCOPY Left     FAMILY HISTORY: No family history on file.  SOCIAL HISTORY:  reports that she has never smoked. She has never used smokeless tobacco. She reports current alcohol use. She reports that she does not use drugs.  PERFORMANCE STATUS: The patient's performance status is 1 - Symptomatic but completely ambulatory  PHYSICAL EXAM: Most Recent Vital Signs: There were no vitals taken for this visit. There were no vitals taken for this visit.  General Appearance:    Alert, cooperative, no distress, appears stated age  Head:    Normocephalic, without obvious abnormality, atraumatic  Eyes:    PERRL, conjunctiva/corneas clear, EOM's intact, fundi    benign, both eyes  Ears:    Normal TM's and external ear canals, both ears   Nose:   Nares normal, septum midline, mucosa normal, no drainage    or sinus tenderness  Throat:   Lips, mucosa, and tongue normal; teeth and gums normal  Neck:   Supple, symmetrical, trachea midline, no adenopathy;    thyroid:  no enlargement/tenderness/nodules; no carotid   bruit or JVD  Back:     Symmetric, no curvature, ROM normal, no CVA tenderness  Lungs:     Clear to auscultation bilaterally, respirations unlabored  Chest Wall:    No tenderness or  deformity   Heart:    Regular rate and rhythm, S1 and S2 normal, no murmur, rub   or gallop  Breast Exam:    No tenderness, masses, or nipple abnormality  Abdomen:     Soft, non-tender, bowel sounds active all four quadrants,    no masses, no organomegaly  Genitalia:    Normal female without lesion, discharge or tenderness  Rectal:    Normal tone, normal prostate, no masses or tenderness;   guaiac negative stool  Extremities:   Extremities normal, atraumatic, no cyanosis or edema  Pulses:   2+ and symmetric all extremities  Skin:   Skin color, texture, turgor normal, no rashes or lesions  Lymph nodes:   Cervical, supraclavicular, and axillary nodes normal  Neurologic:   CNII-XII intact, normal strength, sensation and reflexes    throughout    LABORATORY DATA:  Results for orders placed or performed in visit on 12/11/19 (from the past 48 hour(s))  CBC with Differential ([facility] Center Only)     Status: Abnormal   Collection Time: 12/11/19  2:35 PM  Result Value Ref Range   WBC Count 8.3 4.0 - 10.5 K/uL   RBC 4.55 3.87 - 5.11 MIL/uL   Hemoglobin 12.9 12.0 - 15.0 g/dL   HCT 40.5 36 - 46 %   MCV 89.0 80.0 - 100.0 fL   MCH 28.4 26.0 - 34.0 pg   MCHC 31.9 30.0 - 36.0 g/dL   RDW 13.7 11.5 - 15.5 %   Platelet Count 344 150 - 400 K/uL   nRBC 0.0 0.0 - 0.2 %   Neutrophils Relative % 59 %   Neutro Abs 4.9 1.7 - 7.7 K/uL   Lymphocytes Relative 22 %   Lymphs Abs 1.8 0.7 - 4.0 K/uL   Monocytes Relative 12 %   Monocytes Absolute 1.0 0 -  1 K/uL   Eosinophils Relative 4 %   Eosinophils Absolute 0.3 0 - 0 K/uL   Basophils Relative 1 %   Basophils Absolute 0.1 0 - 0 K/uL   Immature Granulocytes 2 %   Abs Immature Granulocytes 0.14 (H) 0.00 - 0.07 K/uL    Comment: Performed at [facility] at [facility], [address], [place], [place] [address]  Save Smear [facility])     Status: None   Collection Time: 12/11/19  2:35 PM  Result Value Ref Range   Smear Review SMEAR STAINED AND AVAILABLE FOR REVIEW     Comment: Performed at [facility] Lab at [facility], [address], [place], [place] [address]  CMP ([facility] only)     Status: Abnormal   Collection Time: 12/11/19  2:35 PM  Result Value Ref Range   Sodium 138 135 - 145 mmol/L   Potassium 4.0 3.5 - 5.1 mmol/L   Chloride 100 98 - 111 mmol/L   CO2 30 22 - 32 mmol/L   Glucose, Bld 87 70 - 99 mg/dL    Comment: Glucose reference range applies only to samples taken after fasting for at least 8 hours.   BUN 21 8 - 23 mg/dL   Creatinine 1.00 0.44 - 1.00 mg/dL   Calcium 9.3 8.9 - 10.3 mg/dL   Total Protein 6.5 6.5 - 8.1 g/dL   Albumin 3.9 3.5 - 5.0 g/dL   AST 12 (L) 15 - 41 U/L   ALT 8 0 - 44 U/L   Alkaline Phosphatase 75 38 - 126 U/L  Total Bilirubin 0.3 0.3 - 1.2 mg/dL   GFR, Est Non Af Am 59 (L) >60 mL/min   GFR, Est AFR Am >60 >60 mL/min   Anion gap 8 5 - 15    Comment: Performed at [facility] at [facility], [address], [place], [place] [address]  Reticulocytes     Status: Abnormal   Collection Time: 12/11/19  2:36 PM  Result Value Ref Range   Retic Ct Pct 2.1 0.4 - 3.1 %   RBC. 4.52 3.87 - 5.11 MIL/uL   Retic Count, Absolute 92.7 19.0 - 186.0 K/uL   Immature Retic Fract 19.7 (H) 2.3 - 15.9 %    Comment: Performed at [facility] Lab at [facility], [address] [place],  [address]      RADIOGRAPHY: No results found.     PATHOLOGY:  None  ASSESSMENT/PLAN: Ms. Evola is a very pleasant 69 yo female with recent diagnosis of iron deficiency.  She is symptomatic as mentioned above. We will see what her iron studies and erythropoietin level look like and replace if needed.  She is currently on oral iron but has noted constipation and increased GERD. She may better benefit from IV iron.  We will go ahead and plan to see her again in 6 weeks.   All questions were answered and she is in agreement with the plan. She can contact our office with any questions or concerns. We can certainly see her sooner if needed.    Laverna Peace, NP

## 2019-12-12 ENCOUNTER — Telehealth: Payer: Self-pay | Admitting: Family

## 2019-12-12 ENCOUNTER — Telehealth: Payer: Self-pay | Admitting: *Deleted

## 2019-12-12 DIAGNOSIS — D509 Iron deficiency anemia, unspecified: Secondary | ICD-10-CM | POA: Insufficient documentation

## 2019-12-12 LAB — ERYTHROPOIETIN: Erythropoietin: 22.4 m[IU]/mL — ABNORMAL HIGH (ref 2.6–18.5)

## 2019-12-12 LAB — LACTATE DEHYDROGENASE: LDH: 167 U/L (ref 98–192)

## 2019-12-12 LAB — IRON AND TIBC
Iron: 69 ug/dL (ref 41–142)
Saturation Ratios: 16 % — ABNORMAL LOW (ref 21–57)
TIBC: 426 ug/dL (ref 236–444)
UIBC: 357 ug/dL (ref 120–384)

## 2019-12-12 LAB — FERRITIN: Ferritin: 16 ng/mL (ref 11–307)

## 2019-12-12 NOTE — Telephone Encounter (Signed)
Notified pt of results 

## 2019-12-12 NOTE — Telephone Encounter (Signed)
-----   Message from Eliezer Bottom, NP sent at 12/12/2019  1:35 PM EDT ----- She is still quite iron deficient so we will treat with 5 doses of Venofer. She can stop her oral iron supplement. Erythropoietin level is still pending.  Thank you!  ----- Message ----- From: Interface, Lab In [facility] Sent: 12/11/2019   2:49 PM EDT To: Eliezer Bottom, NP

## 2019-12-12 NOTE — Telephone Encounter (Signed)
No los 8/17

## 2019-12-13 ENCOUNTER — Encounter: Payer: Self-pay | Admitting: *Deleted

## 2019-12-17 ENCOUNTER — Ambulatory Visit: Payer: Medicare HMO

## 2019-12-17 ENCOUNTER — Other Ambulatory Visit: Payer: Self-pay

## 2019-12-17 ENCOUNTER — Inpatient Hospital Stay: Payer: Medicare HMO

## 2019-12-17 VITALS — BP 100/71 | HR 78 | Temp 98.1°F | Resp 18

## 2019-12-17 DIAGNOSIS — D508 Other iron deficiency anemias: Secondary | ICD-10-CM

## 2019-12-17 DIAGNOSIS — Z779 Other contact with and (suspected) exposures hazardous to health: Secondary | ICD-10-CM | POA: Diagnosis not present

## 2019-12-17 DIAGNOSIS — N958 Other specified menopausal and perimenopausal disorders: Secondary | ICD-10-CM | POA: Diagnosis not present

## 2019-12-17 DIAGNOSIS — Z6829 Body mass index (BMI) 29.0-29.9, adult: Secondary | ICD-10-CM | POA: Diagnosis not present

## 2019-12-17 DIAGNOSIS — Z124 Encounter for screening for malignant neoplasm of cervix: Secondary | ICD-10-CM | POA: Diagnosis not present

## 2019-12-17 DIAGNOSIS — D509 Iron deficiency anemia, unspecified: Secondary | ICD-10-CM | POA: Diagnosis not present

## 2019-12-17 MED ORDER — SODIUM CHLORIDE 0.9 % IV SOLN
200.0000 mg | Freq: Once | INTRAVENOUS | Status: AC
  Administered 2019-12-17: 200 mg via INTRAVENOUS
  Filled 2019-12-17: qty 200

## 2019-12-17 MED ORDER — SODIUM CHLORIDE 0.9 % IV SOLN
Freq: Once | INTRAVENOUS | Status: AC
  Filled 2019-12-17: qty 250

## 2019-12-17 NOTE — Patient Instructions (Signed)

## 2019-12-19 ENCOUNTER — Inpatient Hospital Stay: Payer: Medicare HMO

## 2019-12-19 ENCOUNTER — Other Ambulatory Visit: Payer: Self-pay

## 2019-12-19 VITALS — BP 123/82 | HR 75 | Temp 98.7°F | Resp 17

## 2019-12-19 DIAGNOSIS — D508 Other iron deficiency anemias: Secondary | ICD-10-CM

## 2019-12-19 DIAGNOSIS — D509 Iron deficiency anemia, unspecified: Secondary | ICD-10-CM | POA: Diagnosis not present

## 2019-12-19 MED ORDER — SODIUM CHLORIDE 0.9 % IV SOLN
Freq: Once | INTRAVENOUS | Status: AC
  Filled 2019-12-19: qty 250

## 2019-12-19 MED ORDER — SODIUM CHLORIDE 0.9 % IV SOLN
200.0000 mg | Freq: Once | INTRAVENOUS | Status: AC
  Administered 2019-12-19: 200 mg via INTRAVENOUS
  Filled 2019-12-19: qty 200

## 2019-12-19 NOTE — Patient Instructions (Signed)

## 2019-12-24 ENCOUNTER — Inpatient Hospital Stay: Payer: Medicare HMO

## 2019-12-24 ENCOUNTER — Other Ambulatory Visit: Payer: Self-pay

## 2019-12-24 VITALS — BP 112/86 | HR 75 | Temp 97.6°F | Resp 17

## 2019-12-24 DIAGNOSIS — D508 Other iron deficiency anemias: Secondary | ICD-10-CM

## 2019-12-24 DIAGNOSIS — D509 Iron deficiency anemia, unspecified: Secondary | ICD-10-CM | POA: Diagnosis not present

## 2019-12-24 MED ORDER — SODIUM CHLORIDE 0.9 % IV SOLN
Freq: Once | INTRAVENOUS | Status: AC
  Filled 2019-12-24: qty 250

## 2019-12-24 MED ORDER — SODIUM CHLORIDE 0.9 % IV SOLN
200.0000 mg | Freq: Once | INTRAVENOUS | Status: AC
  Administered 2019-12-24: 200 mg via INTRAVENOUS
  Filled 2019-12-24: qty 200

## 2019-12-24 NOTE — Patient Instructions (Signed)

## 2019-12-26 ENCOUNTER — Inpatient Hospital Stay: Payer: Medicare HMO | Attending: Hematology & Oncology

## 2019-12-26 ENCOUNTER — Other Ambulatory Visit: Payer: Self-pay

## 2019-12-26 VITALS — BP 118/88 | HR 75 | Temp 98.4°F | Resp 17

## 2019-12-26 DIAGNOSIS — D509 Iron deficiency anemia, unspecified: Secondary | ICD-10-CM | POA: Diagnosis present

## 2019-12-26 DIAGNOSIS — D508 Other iron deficiency anemias: Secondary | ICD-10-CM

## 2019-12-26 MED ORDER — SODIUM CHLORIDE 0.9 % IV SOLN
Freq: Once | INTRAVENOUS | Status: AC
  Filled 2019-12-26: qty 250

## 2019-12-26 MED ORDER — SODIUM CHLORIDE 0.9 % IV SOLN
200.0000 mg | Freq: Once | INTRAVENOUS | Status: AC
  Administered 2019-12-26: 200 mg via INTRAVENOUS
  Filled 2019-12-26: qty 200

## 2019-12-26 NOTE — Patient Instructions (Signed)

## 2019-12-28 ENCOUNTER — Ambulatory Visit: Payer: Medicare HMO

## 2020-01-02 ENCOUNTER — Other Ambulatory Visit: Payer: Self-pay

## 2020-01-02 ENCOUNTER — Inpatient Hospital Stay: Payer: Medicare HMO

## 2020-01-02 VITALS — BP 111/79 | HR 80 | Temp 98.3°F | Resp 17

## 2020-01-02 DIAGNOSIS — D509 Iron deficiency anemia, unspecified: Secondary | ICD-10-CM | POA: Diagnosis not present

## 2020-01-02 DIAGNOSIS — D508 Other iron deficiency anemias: Secondary | ICD-10-CM

## 2020-01-02 MED ORDER — SODIUM CHLORIDE 0.9 % IV SOLN
Freq: Once | INTRAVENOUS | Status: AC
  Filled 2020-01-02: qty 250

## 2020-01-02 MED ORDER — SODIUM CHLORIDE 0.9 % IV SOLN
200.0000 mg | Freq: Once | INTRAVENOUS | Status: AC
  Administered 2020-01-02: 200 mg via INTRAVENOUS
  Filled 2020-01-02: qty 200

## 2020-01-02 NOTE — Progress Notes (Signed)
Pt declined to stay for full post infusion observation period. Pt stated she has tolerated medication multiple times prior without difficulty. Pt aware to call clinic with any questions or concerns. Pt verbalized understanding and had no further questions.   

## 2020-02-06 DIAGNOSIS — R7301 Impaired fasting glucose: Secondary | ICD-10-CM | POA: Diagnosis not present

## 2020-02-06 DIAGNOSIS — E039 Hypothyroidism, unspecified: Secondary | ICD-10-CM | POA: Diagnosis not present

## 2020-02-06 DIAGNOSIS — E7800 Pure hypercholesterolemia, unspecified: Secondary | ICD-10-CM | POA: Diagnosis not present

## 2020-02-13 DIAGNOSIS — E7800 Pure hypercholesterolemia, unspecified: Secondary | ICD-10-CM | POA: Diagnosis not present

## 2020-02-13 DIAGNOSIS — R7301 Impaired fasting glucose: Secondary | ICD-10-CM | POA: Diagnosis not present

## 2020-02-13 DIAGNOSIS — E039 Hypothyroidism, unspecified: Secondary | ICD-10-CM | POA: Diagnosis not present

## 2020-02-13 DIAGNOSIS — I10 Essential (primary) hypertension: Secondary | ICD-10-CM | POA: Diagnosis not present

## 2020-02-18 ENCOUNTER — Ambulatory Visit: Payer: Medicare HMO | Admitting: Internal Medicine

## 2020-02-18 ENCOUNTER — Other Ambulatory Visit: Payer: Self-pay

## 2020-02-18 ENCOUNTER — Encounter: Payer: Self-pay | Admitting: Internal Medicine

## 2020-02-18 DIAGNOSIS — J45991 Cough variant asthma: Secondary | ICD-10-CM | POA: Diagnosis not present

## 2020-02-18 LAB — CBC WITH DIFFERENTIAL/PLATELET
Basophils Absolute: 0.1 10*3/uL (ref 0.0–0.1)
Basophils Relative: 1.4 % (ref 0.0–3.0)
Eosinophils Absolute: 0.3 10*3/uL (ref 0.0–0.7)
Eosinophils Relative: 4.4 % (ref 0.0–5.0)
HCT: 42.7 % (ref 36.0–46.0)
Hemoglobin: 14 g/dL (ref 12.0–15.0)
Lymphocytes Relative: 23.8 % (ref 12.0–46.0)
Lymphs Abs: 1.8 10*3/uL (ref 0.7–4.0)
MCHC: 32.8 g/dL (ref 30.0–36.0)
MCV: 88.6 fl (ref 78.0–100.0)
Monocytes Absolute: 0.9 10*3/uL (ref 0.1–1.0)
Monocytes Relative: 11.5 % (ref 3.0–12.0)
Neutro Abs: 4.4 10*3/uL (ref 1.4–7.7)
Neutrophils Relative %: 58.9 % (ref 43.0–77.0)
Platelets: 327 10*3/uL (ref 150.0–400.0)
RBC: 4.82 Mil/uL (ref 3.87–5.11)
RDW: 14.2 % (ref 11.5–15.5)
WBC: 7.5 10*3/uL (ref 4.0–10.5)

## 2020-02-18 MED ORDER — PREDNISONE 10 MG PO TABS: ORAL_TABLET | ORAL | 0 refills | Status: DC

## 2020-02-18 MED ORDER — PANTOPRAZOLE SODIUM 40 MG PO TBEC: DELAYED_RELEASE_TABLET | ORAL | 2 refills | Status: DC

## 2020-02-18 MED ORDER — BUDESONIDE-FORMOTEROL FUMARATE 80-4.5 MCG/ACT IN AERO: INHALATION_SPRAY | RESPIRATORY_TRACT | 12 refills | Status: DC

## 2020-02-18 NOTE — Assessment & Plan Note (Addendum)
Onset in 1970s and pos response to allergy rx x 5 y with Dr Bernita Buffy - 09/08/2018  After extensive coaching inhaler device,  effectiveness = 75% so try dulera 100 2bid prn flare / maint singulair  - Allergy profile 02/18/2020 >  Eos 0. /  IgE   - 02/18/2020  After extensive coaching inhaler device,  effectiveness =    75% rechallenge with symb 80 2bid and max gerd rx   DDX of  difficult airways management almost all start with A and  include Adherence, Ace Inhibitors, Acid Reflux, Active Sinus Disease, Alpha 1 Antitripsin deficiency, Anxiety masquerading as Airways dz,  ABPA,  Allergy(esp in young), Aspiration (esp in elderly), Adverse effects of meds,  Active smoking or vaping, A bunch of PE's (a small clot burden can't cause this syndrome unless there is already severe underlying pulm or vascular dz with poor reserve) plus two Bs  = Bronchiectasis and Beta blocker use..and one C= CHF  Adherence is always the initial "prime suspect" and is a multilayered concern that requires a "trust but verify" approach in every patient - starting with knowing how to use medications, especially inhalers, correctly, keeping up with refills and understanding the fundamental difference between maintenance and prns vs those medications only taken for a very short course and then stopped and not refilled.  - did not follow recs for symb 80 from last ov so rechallenge  - see hfa teaching - return with all meds in hand using a trust but verify approach to confirm accurate Medication  Reconciliation The principal here is that until we are certain that the  patients are doing what we've asked, it makes no sense to ask them to do more.    ? Acid (or non-acid) GERD > always difficult to exclude as up to 75% of pts in some series report no assoc GI/ Heartburn symptoms and she has large HH> rec max (24h)  acid suppression and diet restrictions/ reviewed and instructions given in writing.   ? Allergy > continue singulair,  send profile  - Prednisone 10 mg take  4 each am x 2 days,   2 each am x 2 days,  1 each am x 2 days and stop   ? ABPA > check IgE   ? Active sinus dz > consider sinus CT  >>> return in 4 weeks         Each maintenance medication was reviewed in detail including emphasizing most importantly the difference between maintenance and prns and under what circumstances the prns are to be triggered using an action plan format where appropriate.  Total time for H and P, chart review, counseling,   and generating customized AVS unique to this office visit / charting  > 30 min

## 2020-02-18 NOTE — Progress Notes (Addendum)
Leslie Hines, female    DOB: December 08, 1950,     MRN: [id number]   Brief patient profile:  13 yowf never smoked with cough/sob around 1978 eval by Dr Bernita Buffy  with dx asthma/allergies rx shots for 5 years some better but eventually placed on advair 250 bid with typical  flares about twice yearly and did fine until Oct 2019 due to oral irritation from advair  and left it off/ maintained on singulair but then flared in Jan 2020  And since restarting "prn" advair has had very difficult to control pattern of "cold and sore throat go to chest so referred to pulmonary clinic 09/08/2018 by Dr   Rex Kras with concern re:  freq pred rx needed to control.    History of Present Illness  09/08/2018  Pulmonary/ 1st office eval/Leslie Hines  Chief Complaint  Patient presents with  . Pulm Consult    Referred by PCP for possible asthmatic bronchitis. Has had several flare ups over the past few years.   Dyspnea:  Not really  limited by breathing from desired activities   Cough: some throat clearing daytimes, some am cough/congestion slt yellow  Sleep: ok lying flat SABA use: twice a month rec For shortness of breath, cough, wheeze > Dulera 100 Take 2 puffs first thing in am and then another 2 puffs about 12 hours later.  Work on inhaler technique:  Only use your albuterol as a rescue medication    02/18/2020  f/u ov/Leslie Hines re: asthma / large [place]  - last 100% better  May 2021 not on maint dulera Chief Complaint  Patient presents with  . Follow-up    Random coughing and wheezing  Dyspnea:  Unless coughing no doe  Cough: white yellow sporadic more day than noct with sensation of pnds Sleeping: bed is still flat/ 3 pillows SABA use: 3 x week it helps some, prednisone works better  02: none    No obvious day to day or daytime variability or assoc   purulent sputum or mucus plugs or hemoptysis or cp or chest tightness,   or overt sinus or hb symptoms.   Sleeping usually  without nocturnal  or early am exacerbation   of respiratory  c/o's or need for noct saba. Also denies any obvious fluctuation of symptoms with weather or environmental changes or other aggravating or alleviating factors except as outlined above   No unusual exposure hx or h/o childhood pna/ asthma or knowledge of premature birth.  Current Allergies, Complete Past Medical History, Past Surgical History, Family History, and Social History were reviewed in [employer] record.  ROS  The following are not active complaints unless bolded Hoarseness, sore throat, dysphagia/globus intermittent, dental problems, itching, sneezing,  nasal congestion or sensation ofdischarge of excess mucus or purulent secretions, ear ache,   fever, chills, sweats, unintended wt loss or wt gain, classically pleuritic or exertional cp,  orthopnea pnd or arm/hand swelling  or leg swelling, presyncope, palpitations, abdominal pain, anorexia, nausea, vomiting, diarrhea  or change in bowel habits or change in bladder habits, change in stools or change in urine, dysuria, hematuria,  rash, arthralgias, visual complaints, headache, numbness, weakness or ataxia or problems with walking or coordination,  change in mood or  memory.        Current Meds  Medication Sig  . albuterol (PROVENTIL HFA;VENTOLIN HFA) 108 (90 Base) MCG/ACT inhaler Inhale 1-2 puffs into the lungs every 6 (six) hours as needed for wheezing or shortness of breath.   Marland Kitchen  ALPRAZolam (XANAX) 0.5 MG tablet Take 0.5 mg by mouth daily as needed for anxiety.   Marland Kitchen aspirin EC 81 MG tablet Take 162 mg by mouth once as needed (for onset of chest pain). Swallow whole.   . estradiol (ESTRACE) 2 MG tablet Take 1 mg by mouth daily.   . ferrous sulfate 325 (65 FE) MG tablet Take 325 mg by mouth daily with breakfast.  . hydrochlorothiazide (HYDRODIURIL) 25 MG tablet Take 1 tablet (25 mg total) by mouth daily. NEED OV for further refills. (Patient taking differently: Take 25 mg by mouth daily. )  .  levothyroxine (SYNTHROID) 100 MCG tablet Take 100 mcg by mouth daily before breakfast.  . magnesium 30 MG tablet Take 30 mg by mouth daily as needed (for leg cramps).   . mometasone-formoterol (DULERA) 100-5 MCG/ACT AERO I not taking, not covinced it helps(poor hfa)   . montelukast (SINGULAIR) 10 MG tablet Take 10 mg by mouth daily.   . pantoprazole (PROTONIX) 20 MG tablet Take 1 tablet (20 mg total) by mouth daily.  Marland Kitchen POTASSIUM PO Take 1 tablet by mouth daily as needed (for leg cramps).   . pravastatin (PRAVACHOL) 40 MG tablet Take 40 mg by mouth at bedtime.  . predniSONE (DELTASONE) 10 MG tablet  finished and helped   . zolpidem (AMBIEN) 10 MG tablet Take 10 mg by mouth at bedtime.           Past Medical History:  Diagnosis Date  . Anxiety   . Arthritis    lt shoulder  . Asthma    allergy related  . Breast mass, left   . Environmental allergies   . Hypertension   . Hypothyroidism       Objective:    Wt Readings from Last 3 Encounters:  02/18/20 172 lb 9.6 oz (78.3 kg)  12/11/19 175 lb 1.9 oz (79.4 kg)  12/06/19 170 lb (77.1 kg)     Vital signs reviewed - Note on arrival 02/18/2020  02 sats  98% on RA    HEENT : pt wearing mask not removed for exam due to covid -19 concerns.    NECK :  without JVD/Nodes/TM/ nl carotid upstrokes bilaterally   LUNGS: no acc muscle use,  Nl contour chest which is clear to A and P bilaterally without cough on insp or exp maneuvers   CV:  RRR  no s3 or murmur or increase in P2, and no edema   ABD:  soft and nontender with nl inspiratory excursion in the supine position. No bruits or organomegaly appreciated, bowel sounds nl  MS:  Nl gait/ ext warm without deformities, calf tenderness, cyanosis or clubbing No obvious joint restrictions   SKIN: warm and dry without lesions    NEURO:  alert, approp, nl sensorium with  no motor or cerebellar deficits apparent.           CXR PA and Lateral:   09/08/2018 :    I personally reviewed  images and agree with radiology impression as follows:    Hiatal hernia- moderate to large. No acute intrathoracic abnormality is noted.   Labs ordered 02/18/2020  :  allergy profile        Assessment

## 2020-02-18 NOTE — Patient Instructions (Addendum)
Stop dulera    Plan A = Automatic = Always=    Symbicort 80 Take 2 puffs first thing in am and then another 2 puffs about 12 hours later.   Work on inhaler technique:  relax and gently blow all the way out then take a nice smooth deep breath back in, triggering the inhaler at same time you start breathing in.  Hold for up to 5 seconds if you can. Blow out thru nose. Rinse and gargle with water when done      Plan B = Backup (to supplement plan A, not to replace it) Only use your albuterol inhaler as a rescue medication to be used if you can't catch your breath by resting or doing a relaxed purse lip breathing pattern.  - The less you use it, the better it will work when you need it. - Ok to use the inhaler up to 2 puffs  every 4 hours if you must but call for appointment if use goes up over your usual need - Don't leave home without it !!  (think of it like the spare tire for your car)     Protonix 40 mg Take 30  min before your first and last meals of the day   GERD (REFLUX)  is an extremely common cause of respiratory symptoms just like yours , many times with no obvious heartburn at all.    It can be treated with medication, but also with lifestyle changes including elevation of the head of your bed (ideally with 6 -8inch blocks under the headboard of your bed),  Smoking cessation, avoidance of late meals, excessive alcohol, and avoid fatty foods, chocolate, peppermint, colas, red wine, and acidic juices such as orange juice.  NO MINT OR MENTHOL PRODUCTS SO NO COUGH DROPS  USE SUGARLESS CANDY INSTEAD (Jolley ranchers or Stover's or Life Savers) or even ice chips will also do - the key is to swallow to prevent all throat clearing. NO OIL BASED VITAMINS - use powdered substitutes.  Avoid fish oil when coughing.   Please remember to go to the lab department   for your tests - we will call you with the results when they are available.      Please schedule a follow up office visit in 4  weeks, sooner if needed   Late Add: Prednisone 10 mg take  4 each am x 2 days,   2 each am x 2 days,  1 each am x 2 days and stop

## 2020-02-19 ENCOUNTER — Other Ambulatory Visit: Payer: Self-pay | Admitting: Internal Medicine

## 2020-02-19 DIAGNOSIS — I10 Essential (primary) hypertension: Secondary | ICD-10-CM | POA: Diagnosis not present

## 2020-02-19 DIAGNOSIS — D509 Iron deficiency anemia, unspecified: Secondary | ICD-10-CM | POA: Diagnosis not present

## 2020-02-19 DIAGNOSIS — Z0000 Encounter for general adult medical examination without abnormal findings: Secondary | ICD-10-CM | POA: Diagnosis not present

## 2020-02-19 DIAGNOSIS — E038 Other specified hypothyroidism: Secondary | ICD-10-CM | POA: Diagnosis not present

## 2020-02-19 LAB — IGE: IgE (Immunoglobulin E), Serum: 37 kU/L (ref <=114)

## 2020-02-19 MED ORDER — PREDNISONE 10 MG PO TABS: ORAL_TABLET | ORAL | 0 refills | Status: DC

## 2020-03-31 ENCOUNTER — Ambulatory Visit: Payer: Medicare HMO | Admitting: Internal Medicine

## 2020-03-31 ENCOUNTER — Other Ambulatory Visit: Payer: Self-pay

## 2020-03-31 ENCOUNTER — Encounter: Payer: Self-pay | Admitting: Internal Medicine

## 2020-03-31 DIAGNOSIS — J45991 Cough variant asthma: Secondary | ICD-10-CM

## 2020-03-31 MED ORDER — PREDNISONE 10 MG PO TABS: ORAL_TABLET | ORAL | 0 refills | Status: DC

## 2020-03-31 NOTE — Assessment & Plan Note (Signed)
Onset in 1970s and pos response to allergy rx x 5 y with Dr Bernita Buffy - 09/08/2018  After extensive coaching inhaler device,  effectiveness = 75% so try dulera 100 2bid prn flare / maint singulair  - Allergy profile 02/18/2020 >  Eos 0.3 /  IgE  37 - 02/18/2020  After extensive coaching inhaler device,  effectiveness =    75% rechallenge with symb 80 2bid   > some better 03/31/2020 but not as good as while on prednisone despite max gerd rx / diet   Will therefore rechallenge with 6 days to be sure the coughing resolves with prednisone and if so try the 160 2bid with the caveat that this may aggravate Upper airway cough syndrome (previously labeled PNDS),  is so named because it's frequently impossible to sort out how much is  CR/sinusitis with freq throat clearing (which can be related to primary GERD)   vs  causing  secondary (" extra esophageal")  GERD from wide swings in gastric pressure that occur with throat clearing, often  promoting self use of mint and menthol lozenges that reduce the lower esophageal sphincter tone and exacerbate the problem further in a cyclical fashion.   These are the same pts (now being labeled as having "irritable larynx syndrome" by some cough centers) who not infrequently have a history of having failed to tolerate ace inhibitors,  dry powder inhalers or biphosphonates or report having atypical/extraesophageal reflux symptoms that don't respond to standard doses of PPI  and are easily confused as having aecopd or asthma flares by even experienced allergists/ pulmonologists (myself included).   >>> f/u in 6 weeks   - The proper method of use, as well as anticipated side effects, of a metered-dose inhaler were discussed and demonstrated to the patient using teach back method.         Each maintenance medication was reviewed in detail including emphasizing most importantly the difference between maintenance and prns and under what circumstances the prns are to be  triggered using an action plan format where appropriate.  Total time for H and P, chart review, counseling, teaching device and generating customized AVS unique to this office visit / charting  > 30 min

## 2020-03-31 NOTE — Progress Notes (Signed)
Leslie Hines, female    DOB: 10/04/1950,     MRN: [id number]   Brief patient profile:  17  yowf never smoked with cough/sob around 1978 eval by Dr Bernita Buffy  with dx asthma/allergies rx shots for 5 years some better but eventually placed on advair 250 bid with typical  flares about twice yearly and did fine until Oct 2019 due to oral irritation from advair  and left it off/ maintained on singulair but then flared in Jan 2020  And since restarting "prn" advair has had very difficult to control pattern of "cold and sore throat go to chest so referred to pulmonary clinic 09/08/2018 by Dr   Rex Kras with concern re:  freq pred rx needed to control.    History of Present Illness  09/08/2018  Pulmonary/ 1st office eval/Leslie Hines  Chief Complaint  Patient presents with  . Pulm Consult    Referred by PCP for possible asthmatic bronchitis. Has had several flare ups over the past few years.   Dyspnea:  Not really  limited by breathing from desired activities   Cough: some throat clearing daytimes, some am cough/congestion slt yellow  Sleep: ok lying flat SABA use: twice a month rec For shortness of breath, cough, wheeze > Dulera 100 Take 2 puffs first thing in am and then another 2 puffs about 12 hours later.  Work on inhaler technique:  Only use your albuterol as a rescue medication    02/18/2020  f/u ov/Leslie Hines re: asthma / large [place]  - last 100% better  May 2021 not on maint dulera Chief Complaint  Patient presents with  . Follow-up    Random coughing and wheezing  Dyspnea:  Unless coughing no doe  Cough: white yellow sporadic more day than noct with sensation of pnds Sleeping: bed is still flat/ 3 pillows SABA use: 3 x week it helps some, prednisone works better  02: none  rec Stop dulera  Plan A = Automatic = Always=    Symbicort 80 Take 2 puffs first thing in am and then another 2 puffs about 12 hours later.  Work on Orthoptist B = Backup (to supplement plan A, not to replace  it) Only use your albuterol inhaler as a rescue medication  Protonix 40 mg Take 30  min before your first and last meals of the day  GERD . Please remember to go to the lab department:    Eos 0.3 /  IgE  37 Late Add: Prednisone 10 mg take  4 each am x 2 days,   2 each am x 2 days,  1 each am x 2 days and stop    03/31/2020  f/u ov/Leslie Hines re: asthma / maint on symb 54 2bid / singulair /  Chief Complaint  Patient presents with  . Follow-up    doing well, occ cough with clear mucous  Dyspnea:  Only if coughing  Cough: better on prednisone, some worse off it despite maint symb 80 2bid with acceptable technique Sleeping:  Bed is flat/ 3pillows no bed blocks SABA use: once a week 02: none    No obvious day to day or daytime variability or assoc excess/ purulent sputum or mucus plugs or hemoptysis or cp or chest tightness, subjective wheeze or overt sinus or hb symptoms.   Sleeping as above  without nocturnal  or early am exacerbation  of respiratory  c/o's or need for noct saba. Also denies any obvious fluctuation of symptoms with weather  or environmental changes or other aggravating or alleviating factors except as outlined above   No unusual exposure hx or h/o childhood pna/ asthma or knowledge of premature birth.  Current Allergies, Complete Past Medical History, Past Surgical History, Family History, and Social History were reviewed in [employer] record.  ROS  The following are not active complaints unless bolded Hoarseness, sore throat, dysphagia, dental problems, itching, sneezing,  nasal congestion or discharge of excess mucus or purulent secretions, ear ache,   fever, chills, sweats, unintended wt loss or wt gain, classically pleuritic or exertional cp,  orthopnea pnd or arm/hand swelling  or leg swelling, presyncope, palpitations, abdominal pain, anorexia, nausea, vomiting, diarrhea  or change in bowel habits or change in bladder habits, change in stools or change  in urine, dysuria, hematuria,  rash, arthralgias, visual complaints, headache, numbness, weakness or ataxia or problems with walking or coordination,  change in mood or  memory.        Current Meds  Medication Sig  . albuterol (PROVENTIL HFA;VENTOLIN HFA) 108 (90 Base) MCG/ACT inhaler Inhale 1-2 puffs into the lungs every 6 (six) hours as needed for wheezing or shortness of breath.   . ALPRAZolam (XANAX) 0.5 MG tablet Take 0.5 mg by mouth daily as needed for anxiety.   Marland Kitchen aspirin EC 81 MG tablet Take 162 mg by mouth once as needed (for onset of chest pain). Swallow whole.   . budesonide-formoterol (SYMBICORT) 80-4.5 MCG/ACT inhaler Take 2 puffs first thing in am and then another 2 puffs about 12 hours later.  Marland Kitchen estradiol (ESTRACE) 2 MG tablet Take 1 mg by mouth daily.   . ferrous sulfate 325 (65 FE) MG tablet Take 325 mg by mouth daily with breakfast.  . hydrochlorothiazide (HYDRODIURIL) 25 MG tablet Take 1 tablet (25 mg total) by mouth daily. NEED OV for further refills. (Patient taking differently: Take 25 mg by mouth daily. )  . levothyroxine (SYNTHROID) 100 MCG tablet Take 100 mcg by mouth daily before breakfast.  . magnesium 30 MG tablet Take 30 mg by mouth daily as needed (for leg cramps).   . montelukast (SINGULAIR) 10 MG tablet Take 10 mg by mouth daily.   . pantoprazole (PROTONIX) 40 MG tablet Take 30- 60 min before your first and last meals of the day  . POTASSIUM PO Take 1 tablet by mouth daily as needed (for leg cramps).   . pravastatin (PRAVACHOL) 40 MG tablet Take 40 mg by mouth at bedtime.  Marland Kitchen zolpidem (AMBIEN) 10 MG tablet Take 10 mg by mouth at bedtime.           Past Medical History:  Diagnosis Date  . Anxiety   . Arthritis    lt shoulder  . Asthma    allergy related  . Breast mass, left   . Environmental allergies   . Hypertension   . Hypothyroidism       Objective:    03/31/2020        172   02/18/20 172 lb 9.6 oz (78.3 kg)  12/11/19 175 lb 1.9 oz (79.4 kg)   12/06/19 170 lb (77.1 kg)    Vital signs reviewed  03/31/2020  - Note at rest 02 sats  97% on RA    amb wf with freq vigorous throat clearing     HEENT : pt wearing mask not removed for exam due to covid -19 concerns.    NECK :  without JVD/Nodes/TM/ nl carotid upstrokes bilaterally   LUNGS:  no acc muscle use,  Nl contour chest which is clear to A and P bilaterally without cough on insp or exp maneuvers   CV:  RRR  no s3 or murmur or increase in P2, and no edema   ABD:  soft and nontender with nl inspiratory excursion in the supine position. No bruits or organomegaly appreciated, bowel sounds nl  MS:  Nl gait/ ext warm without deformities, calf tenderness, cyanosis or clubbing No obvious joint restrictions   SKIN: warm and dry without lesions    NEURO:  alert, approp, nl sensorium with  no motor or cerebellar deficits apparent.                    Assessment

## 2020-03-31 NOTE — Patient Instructions (Signed)
Prednisone 10 mg take  4 each am x 2 days,   2 each am x 2 days,  1 each am x 2 days and stop to see if the throat clearing and the cough all resolve and whether they worse again on the low dose symbicort    Then the next refill for symbicort would be for the 160 strength.   Please schedule a follow up office visit in 6 weeks, call sooner if needed

## 2020-04-08 DIAGNOSIS — R69 Illness, unspecified: Secondary | ICD-10-CM | POA: Diagnosis not present

## 2020-04-09 DIAGNOSIS — E7800 Pure hypercholesterolemia, unspecified: Secondary | ICD-10-CM | POA: Diagnosis not present

## 2020-04-09 DIAGNOSIS — J45909 Unspecified asthma, uncomplicated: Secondary | ICD-10-CM | POA: Diagnosis not present

## 2020-04-09 DIAGNOSIS — I10 Essential (primary) hypertension: Secondary | ICD-10-CM | POA: Diagnosis not present

## 2020-04-09 DIAGNOSIS — G8929 Other chronic pain: Secondary | ICD-10-CM | POA: Diagnosis not present

## 2020-04-09 DIAGNOSIS — E038 Other specified hypothyroidism: Secondary | ICD-10-CM | POA: Diagnosis not present

## 2020-04-09 DIAGNOSIS — D509 Iron deficiency anemia, unspecified: Secondary | ICD-10-CM | POA: Diagnosis not present

## 2020-04-22 DIAGNOSIS — E039 Hypothyroidism, unspecified: Secondary | ICD-10-CM | POA: Diagnosis not present

## 2020-05-13 ENCOUNTER — Ambulatory Visit: Payer: Medicare HMO | Admitting: Internal Medicine

## 2020-06-09 ENCOUNTER — Other Ambulatory Visit: Payer: Self-pay

## 2020-06-09 ENCOUNTER — Encounter: Payer: Self-pay | Admitting: Internal Medicine

## 2020-06-09 ENCOUNTER — Ambulatory Visit: Payer: Medicare HMO | Admitting: Internal Medicine

## 2020-06-09 DIAGNOSIS — J45991 Cough variant asthma: Secondary | ICD-10-CM

## 2020-06-09 MED ORDER — FAMOTIDINE 20 MG PO TABS: ORAL_TABLET | ORAL | 11 refills | Status: DC

## 2020-06-09 NOTE — Progress Notes (Signed)
Leslie Hines, female    DOB: 08/24/1950     MRN: [id number]   Brief patient profile:  73  yowf never smoked with cough/sob around 1978 eval by Dr Bernita Buffy  with dx asthma/allergies rx shots for 5 years some better but eventually placed on advair 250 bid with typical  flares about twice yearly and did fine until Oct 2019 due to oral irritation from advair  and left it off/ maintained on singulair but then flared in Jan 2020  And since restarting "prn" advair has had very difficult to control pattern of "cold and sore throat go to chest so referred to pulmonary clinic 09/08/2018 by Dr   Rex Kras with concern re:  freq pred rx needed to control.    History of Present Illness  09/08/2018  Pulmonary/ 1st office eval/Zayla Agar  Chief Complaint  Patient presents with  . Pulm Consult    Referred by PCP for possible asthmatic bronchitis. Has had several flare ups over the past few years.   Dyspnea:  Not really  limited by breathing from desired activities   Cough: some throat clearing daytimes, some am cough/congestion slt yellow  Sleep: ok lying flat SABA use: twice a month rec For shortness of breath, cough, wheeze > Dulera 100 Take 2 puffs first thing in am and then another 2 puffs about 12 hours later.  Work on inhaler technique:  Only use your albuterol as a rescue medication    02/18/2020  f/u ov/Keniyah Gelinas re: asthma / large [place]  - last 100% better  May 2021 not on maint dulera Chief Complaint  Patient presents with  . Follow-up    Random coughing and wheezing  Dyspnea:  Unless coughing no doe  Cough: white yellow sporadic more day than noct with sensation of pnds Sleeping: bed is still flat/ 3 pillows SABA use: 3 x week it helps some, prednisone works better  02: none  rec Stop dulera  Plan A = Automatic = Always=    Symbicort 80 Take 2 puffs first thing in am and then another 2 puffs about 12 hours later.  Work on Orthoptist B = Backup (to supplement plan A, not to replace  it) Only use your albuterol inhaler as a rescue medication  Protonix 40 mg Take 30  min before your first and last meals of the day  GERD . Please remember to go to the lab department:    Eos 0.3 /  IgE  37 Late Add: Prednisone 10 mg take  4 each am x 2 days,   2 each am x 2 days,  1 each am x 2 days and stop    03/31/2020  f/u ov/Shelisha Gautier re: asthma / maint on symb 17 2bid / singulair /  Chief Complaint  Patient presents with  . Follow-up    doing well, occ cough with clear mucous  Dyspnea:  Only if coughing  Cough: better on prednisone, some worse off it despite maint symb 80 2bid with acceptable technique Sleeping:  Bed is flat/ 3 pillows no bed blocks SABA use: once a week 02: none  rec Prednisone 10 mg take  4 each am x 2 days,   2 each am x 2 days,  1 each am x 2 days and stop to see if the throat clearing and the cough all resolve and whether they worse again on the low dose symbicort  Then the next refill for symbicort would be for the 160 strength.  06/09/2020  f/u ov/Marisha Renier re: symb 80 2bid/ singulair and ppi bid ac Chief Complaint  Patient presents with  . Follow-up    Breathing is doing well and she is not coughing much at all. She rarely uses her albuterol inhaler.   Dyspnea:  Only when coughing  Cough: better now, tends to worst in August and winter  Sleeping: bed is flat/ sleeping on  SABA use: rarely  02: none  Covid status:   Max vax   No obvious day to day or daytime variability or assoc excess/ purulent sputum or mucus plugs or hemoptysis or cp or chest tightness, subjective wheeze or overt sinus or hb symptoms.   Sleeping  without nocturnal  or early am exacerbation  of respiratory  c/o's or need for noct saba. Also denies any obvious fluctuation of symptoms with weather or environmental changes or other aggravating or alleviating factors except as outlined above   No unusual exposure hx or h/o childhood pna/ asthma or knowledge of premature birth.  Current  Allergies, Complete Past Medical History, Past Surgical History, Family History, and Social History were reviewed in [employer] record.  ROS  The following are not active complaints unless bolded Hoarseness, sore throat, dysphagia, dental problems, itching, sneezing,  nasal congestion or discharge of excess mucus or purulent secretions, ear ache,   fever, chills, sweats, unintended wt loss or wt gain, classically pleuritic or exertional cp,  orthopnea pnd or arm/hand swelling  or leg swelling, presyncope, palpitations, abdominal pain, anorexia, nausea, vomiting, diarrhea  or change in bowel habits or change in bladder habits, change in stools or change in urine, dysuria, hematuria,  rash, arthralgias, visual complaints, headache, numbness, weakness or ataxia or problems with walking or coordination,  change in mood or  memory.        Current Meds  Medication Sig  . albuterol (PROVENTIL HFA;VENTOLIN HFA) 108 (90 Base) MCG/ACT inhaler Inhale 1-2 puffs into the lungs every 6 (six) hours as needed for wheezing or shortness of breath.   . ALPRAZolam (XANAX) 0.5 MG tablet Take 0.5 mg by mouth daily as needed for anxiety.   Marland Kitchen aspirin EC 81 MG tablet Take 162 mg by mouth once as needed (for onset of chest pain). Swallow whole.   . budesonide-formoterol (SYMBICORT) 80-4.5 MCG/ACT inhaler Take 2 puffs first thing in am and then another 2 puffs about 12 hours later.  Marland Kitchen estradiol (ESTRACE) 2 MG tablet Take 1 mg by mouth daily.   . ferrous sulfate 325 (65 FE) MG tablet Take 325 mg by mouth daily with breakfast.  . hydrochlorothiazide (HYDRODIURIL) 25 MG tablet Take 1 tablet (25 mg total) by mouth daily. NEED OV for further refills. (Patient taking differently: Take 25 mg by mouth daily.)  . levothyroxine (SYNTHROID) 100 MCG tablet Take 100 mcg by mouth daily before breakfast.  . magnesium 30 MG tablet Take 30 mg by mouth daily as needed (for leg cramps).   . montelukast (SINGULAIR) 10 MG  tablet Take 10 mg by mouth daily.   . pantoprazole (PROTONIX) 40 MG tablet Take 30- 60 min before your first and last meals of the day  . POTASSIUM PO Take 1 tablet by mouth daily as needed (for leg cramps).   . pravastatin (PRAVACHOL) 40 MG tablet Take 40 mg by mouth at bedtime.  .     . zolpidem (AMBIEN) 10 MG tablet Take 10 mg by mouth at bedtime.  Past Medical History:  Diagnosis Date  . Anxiety   . Arthritis    lt shoulder  . Asthma    allergy related  . Breast mass, left   . Environmental allergies   . Hypertension   . Hypothyroidism       Objective:      06/09/2020        179  03/31/2020        172   02/18/20 172 lb 9.6 oz (78.3 kg)  12/11/19 175 lb 1.9 oz (79.4 kg)  12/06/19 170 lb (77.1 kg)     Vital signs reviewed  06/09/2020  - Note at rest 02 sats  96% on RA   General appearance:    amb pleasant wf nad   HEENT : pt wearing mask not removed for exam due to covid -19 concerns.    NECK :  without JVD/Nodes/TM/ nl carotid upstrokes bilaterally   LUNGS: no acc muscle use,  Nl contour chest which is clear to A and P bilaterally without cough on insp or exp maneuvers   CV:  RRR  no s3 or murmur or increase in P2, and no edema   ABD:  soft and nontender with nl inspiratory excursion     MS:   ext warm without deformities, calf tenderness, cyanosis or clubbing No obvious joint restrictions   SKIN: warm and dry without lesions    NEURO:  alert, approp, nl sensorium with  no motor or cerebellar deficits apparent.           Assessment

## 2020-06-09 NOTE — Assessment & Plan Note (Signed)
Onset in 1970s and pos response to allergy rx x 5 y with Dr Gene [place] - 09/08/2018  After extensive coaching inhaler device,  effectiveness = 75% so try dulera 100 2bid prn flare / maint singulair  - Allergy profile 02/18/2020 >  Eos 0.3 /  IgE  37 -06/09/2020  After extensive coaching inhaler device,  effectiveness =    80% > continue symb 80 2bid and wean off ppi in favor of pepcid   All goals of chronic asthma control met including optimal function and elimination of symptoms with minimal need for rescue therapy.  Contingencies discussed in full including contacting this office immediately if not controlling the symptoms using the rule of two's.     Re PPI: Discussed the recent press about ppi's in the context of a statistically significant (but questionably clinically relevant) increase in CRI in pts on ppi vs h2's > bottom line is the lowest dose of ppi that controls   gerd is the right dose and if that dose is zero that's fine esp since h2's are cheaper > try taper off ppi in favor of pepcid 20 mg bid pc  F/u q 6 m         Each maintenance medication was reviewed in detail including emphasizing most importantly the difference between maintenance and prns and under what circumstances the prns are to be triggered using an action plan format where appropriate.  Total time for H and P, chart review, counseling, reviewing hfa device(s) and generating customized AVS unique to this office visit / same day charting = 32 min      

## 2020-06-09 NOTE — Patient Instructions (Addendum)
Pantoprazole (protonix) 40 mg   Take  30-60 min before first meal of the day and try  Pepcid (famotidine)  20 mg one @  Supper  until return to office - this is the best way to tell whether stomach acid is contributing to your problem.   If doing fine after 2 weeks, then change to pepcid 20  mg after bfast and after supper   Please schedule a follow up visit in 6 months but call sooner if needed

## 2020-06-19 DIAGNOSIS — D509 Iron deficiency anemia, unspecified: Secondary | ICD-10-CM | POA: Diagnosis not present

## 2020-06-19 DIAGNOSIS — I10 Essential (primary) hypertension: Secondary | ICD-10-CM | POA: Diagnosis not present

## 2020-06-19 DIAGNOSIS — E038 Other specified hypothyroidism: Secondary | ICD-10-CM | POA: Diagnosis not present

## 2020-06-19 DIAGNOSIS — J45909 Unspecified asthma, uncomplicated: Secondary | ICD-10-CM | POA: Diagnosis not present

## 2020-06-19 DIAGNOSIS — G8929 Other chronic pain: Secondary | ICD-10-CM | POA: Diagnosis not present

## 2020-06-19 DIAGNOSIS — E7800 Pure hypercholesterolemia, unspecified: Secondary | ICD-10-CM | POA: Diagnosis not present

## 2020-10-06 ENCOUNTER — Other Ambulatory Visit: Payer: Self-pay | Admitting: *Deleted

## 2020-10-06 ENCOUNTER — Telehealth: Payer: Self-pay | Admitting: Internal Medicine

## 2020-10-06 MED ORDER — PREDNISONE 10 MG PO TABS: ORAL_TABLET | ORAL | 0 refills | Status: AC

## 2020-10-06 NOTE — Telephone Encounter (Signed)
Primary Pulmonologist: Wert Last office visit and with whom: 06/09/20 Wert What do we see them for (pulmonary problems): Cough variant asthma Last OV assessment/plan:     Assessment & Plan Note by Wert, Michael B, MD at 06/09/2020 3:21 PM  Author: Wert, Michael B, MD Author Type: Physician Filed: 06/09/2020  3:22 PM  Note Status: Written Cosign: Cosign Not Required Encounter Date: 06/09/2020  Problem: Cough variant asthma  Editor: Wert, Michael B, MD (Physician)               Onset in 1970s and pos response to allergy rx x 5 y with Dr Gene [place] - 09/08/2018  After extensive coaching inhaler device,  effectiveness = 75% so try dulera 100 2bid prn flare / maint singulair - Allergy profile 02/18/2020 >  Eos 0.3 /  IgE  37 -06/09/2020  After extensive coaching inhaler device,  effectiveness =    80% > continue symb 80 2bid and wean off ppi in favor of pepcid   All goals of chronic asthma control met including optimal function and elimination of symptoms with minimal need for rescue therapy.   Contingencies discussed in full including contacting this office immediately if not controlling the symptoms using the rule of two's.      Re PPI: Discussed the recent press about ppi's in the context of a statistically significant (but questionably clinically relevant) increase in CRI in pts on ppi vs h2's > bottom line is the lowest dose of ppi that controls   gerd is the right dose and if that dose is zero that's fine esp since h2's are cheaper > try taper off ppi in favor of pepcid 20 mg bid pc   F/u q 6 m           Each maintenance medication was reviewed in detail including emphasizing most importantly the difference between maintenance and prns and under what circumstances the prns are to be triggered using an action plan format where appropriate.   Total time for H and P, chart review, counseling, reviewing hfa device(s) and generating customized AVS unique to this office visit / same day  charting = 32 min              Patient Instructions by Wert, Michael B, MD at 06/09/2020 2:45 PM  Author: Wert, Michael B, MD Author Type: Physician Filed: 06/09/2020  3:18 PM  Note Status: Addendum Cosign: Cosign Not Required Encounter Date: 06/09/2020  Editor: Wert, Michael B, MD (Physician)      Prior Versions: 1. Wert, Michael B, MD (Physician) at 06/09/2020  3:16 PM - Signed    Pantoprazole (protonix) 40 mg   Take  30-60 min before first meal of the day and try  Pepcid (famotidine)  20 mg one @  Supper  until return to office - this is the best way to tell whether stomach acid is contributing to your problem.   If doing fine after 2 weeks, then change to pepcid 20  mg after bfast and after supper   Please schedule a follow up visit in 6 months but call sooner if needed           Instructions  Pantoprazole (protonix) 40 mg   Take  30-60 min before first meal of the day and try  Pepcid (famotidine)  20 mg one @  Supper  until return to office - this is the best way to tell whether stomach acid is contributing to your problem.   If   doing fine after 2 weeks, then change to pepcid 20  mg after bfast and after supper   Please schedule a follow up visit in 6 months but call sooner if needed         Reason for call:  Patient states during her last visit Dr. Melvyn Novas decreased her reflux medication and told her that if she needed to, to increase it back to the original dose.  She states for the past 5 wks she has been taking the pantoprazole 50m (275mtabs) in the morning and Pepcid 2044mt HS.  The past 6 days she has had to use her rescue inhaler 2-4 times daily with little relief.  She is having sob, coughing (sometimes for an hour and a half at a time) and wheezing.  She does not know if this just a summer thing or if she is having a flare up of her asthma.  Usually she take a prednisone taper and it knocks it out.  She is using her Symbicort 2 puffs once a day.  Tammy, please advise.   Thank you.

## 2020-10-06 NOTE — Telephone Encounter (Signed)
Called and spoke with patient, advised of recommendations per TP.  She verbalized understanding.  Scheduled to see BW on 7/14 the patient will be out of town until then.  No appointment available for MW during the 3-4 week timeframe.  Verified pharmacy.  Nothing further needed.

## 2020-10-06 NOTE — Telephone Encounter (Signed)
Does sound that patient is having an asthma flare.  Can send in a prednisone taper-prednisone 10 mg number 24 tablets for 2 days, 3 tablets daily for 2 days, 2 tablets daily for 2 days, 1 tablet daily for 2 days and stop Increase Symbicort to 2 puffs twice daily  Needs office visit with Dr. Melvyn Novas for follow-up in the next 3 to 4 weeks.  If she is not improving or symptoms worsen will need a sooner follow-up or go to the emergency room  Please contact office for sooner follow up if symptoms do not improve or worsen or seek emergency care

## 2020-11-06 ENCOUNTER — Other Ambulatory Visit: Payer: Self-pay

## 2020-11-06 ENCOUNTER — Ambulatory Visit: Payer: Medicare HMO | Admitting: Primary Care

## 2020-11-06 DIAGNOSIS — J45991 Cough variant asthma: Secondary | ICD-10-CM | POA: Diagnosis not present

## 2020-11-06 MED ORDER — FAMOTIDINE 20 MG PO TABS: ORAL_TABLET | ORAL | 11 refills | Status: DC

## 2020-11-06 MED ORDER — MONTELUKAST SODIUM 10 MG PO TABS: 10.0000 mg | ORAL_TABLET | Freq: Every day | ORAL | 11 refills | Status: DC

## 2020-11-06 MED ORDER — PANTOPRAZOLE SODIUM 20 MG PO TBEC: DELAYED_RELEASE_TABLET | ORAL | 5 refills | Status: DC

## 2020-11-06 NOTE — Patient Instructions (Signed)
Recommendations: - Take Protonix 20-40mg  first thing in the morning - Take Pepcid 20mg  at bedtime  - Continue Symbicort 2 puffs 1-2 times daily - Continue Singulair 10mg  at bedtime  - Use albuterol 2 puffs every 4-6 hours as needed for breakthrough symptoms - Notify our office if you have flare up in cough variant asthma symptoms. Refills were sent to pharmacy on file.   Follow-up: - 1 year with Dr. Melvyn Novas or sooner if needed

## 2020-11-06 NOTE — Assessment & Plan Note (Signed)
-   Treated for flare asthma symptoms with prednisone taper in June 2022. She is compliant with Symbicort 15mcg 1-2 times a day and Singulair 10mg  at bedtime. She needed to resume Protonix and Pepcid shortly after stopping as her cough returned.  Cough is currently well controlled on PPI/H2 blocker. No changes today, refills provided.  FU in 1 year with Dr. Melvyn Novas or sooner if needed

## 2020-11-06 NOTE — Progress Notes (Signed)
@Patient  ID: Leslie Hines, female    DOB: 11-15-1950, 70 y.o.   MRN: [id number]  Chief Complaint  Patient presents with   Follow-up    F/U for cough variant asthma. She reports increased wheezing due to recent rise in humidity. Denies cough. Using symbicort daily. Barely using the albuterol. She is using both pepcid and protonix to manage cough.     Referring provider: Marda Stalker, PA-C   Brief patient profile:  31  yowf never smoked with cough/sob around 1978 eval by Dr Bernita Buffy  with dx asthma/allergies rx shots for 5 years some better but eventually placed on advair 250 bid with typical  flares about twice yearly and did fine until Oct 2019 due to oral irritation from advair  and left it off/ maintained on singulair but then flared in Jan 2020  And since restarting "prn" advair has had very difficult to control pattern of "cold and sore throat go to chest so referred to pulmonary clinic 09/08/2018 by Dr   Rex Kras with concern re:  freq pred rx needed to control.  HPI: 70 year old female, never smoked.  Past medical history significant for hypertension, cough variant asthma, and deficiency anemia.  Patient of Dr. Melvyn Novas, last seen in office on 06/09/2020.  Previous [facility] pulmonary encounter: 06/09/2020  f/u ov/Wert re: symb 80 2bid/ singulair and ppi bid ac     Chief Complaint  Patient presents with   Follow-up      Breathing is doing well and she is not coughing much at all. She rarely uses her albuterol inhaler.   Dyspnea:  Only when coughing  Cough: better now, tends to worst in August and winter  Sleeping: bed is flat/ sleeping on  SABA use: rarely  02: none  Covid status:   Max vax  11/06/2020- Interim hx Patient presents today for 5-6 month follow-up. She called our office in mid- June with report of increased shortness of breath, wheezing and cough. She was send in prednisone taper and told to schedule a follow-up visit with [place] in the next couple of weeks.   During her  last visit with Dr. Melvyn Novas he decreased her reflux medication, her cough returned less than a month later. She resumed Pantoprazole 40mg  qam and Pepcid 20mg  at bedtime. Her cough is currently well controlled.   Cough: better on Symb 80 and Protonix/Pepcid  Sleeping: bed is flat SABA use: rare use 02: none   Allergies  Allergen Reactions   Macrobid [Nitrofurantoin Macrocrystal] Nausea Only and Other (See Comments)    "Made me deathly sick"   Lisinopril Nausea Only and Other (See Comments)    "Dizziness," also   Sulfa Antibiotics Hives    Immunization History  Administered Date(s) Administered   Influenza, Quadrivalent, Recombinant, Inj, Pf 01/26/2017, 02/01/2018   Influenza-Unspecified 01/27/2016, 03/26/2020   PFIZER(Purple Top)SARS-COV-2 Vaccination 06/09/2019, 07/04/2019, 01/22/2020    Past Medical History:  Diagnosis Date   Anxiety    Arthritis    lt shoulder   Asthma    allergy related   Breast mass, left    Environmental allergies    Hypertension    Hypothyroidism     Tobacco History: Social History   Tobacco Use  Smoking Status Never  Smokeless Tobacco Never   Counseling given: Not Answered   Outpatient Medications Prior to Visit  Medication Sig Dispense Refill   albuterol (PROVENTIL HFA;VENTOLIN HFA) 108 (90 Base) MCG/ACT inhaler Inhale 1-2 puffs into the lungs every 6 (six) hours as needed  for wheezing or shortness of breath.      ALPRAZolam (XANAX) 0.5 MG tablet Take 0.5 mg by mouth daily as needed for anxiety.      aspirin EC 81 MG tablet Take 162 mg by mouth once as needed (for onset of chest pain). Swallow whole.      budesonide-formoterol (SYMBICORT) 80-4.5 MCG/ACT inhaler Take 2 puffs first thing in am and then another 2 puffs about 12 hours later. 1 each 12   estradiol (ESTRACE) 2 MG tablet Take 1 mg by mouth daily.      levothyroxine (SYNTHROID) 100 MCG tablet Take 100 mcg by mouth daily before breakfast.     magnesium 30 MG tablet Take 30 mg by  mouth daily as needed (for leg cramps).      POTASSIUM PO Take 1 tablet by mouth daily as needed (for leg cramps).      pravastatin (PRAVACHOL) 40 MG tablet Take 40 mg by mouth at bedtime.     zolpidem (AMBIEN) 10 MG tablet Take 10 mg by mouth at bedtime.      famotidine (PEPCID) 20 MG tablet One after bfast and after supper 60 tablet 11   montelukast (SINGULAIR) 10 MG tablet Take 10 mg by mouth daily.      pantoprazole (PROTONIX) 40 MG tablet Take 30- 60 min before your first and last meals of the day 60 tablet 2   ferrous sulfate 325 (65 FE) MG tablet Take 325 mg by mouth daily with breakfast. (Patient not taking: Reported on 11/06/2020)     hydrochlorothiazide (HYDRODIURIL) 25 MG tablet Take 1 tablet (25 mg total) by mouth daily. NEED OV for further refills. (Patient not taking: Reported on 11/06/2020) 15 tablet 0   No facility-administered medications prior to visit.   Review of Systems  Review of Systems  Constitutional: Negative.   HENT: Negative.    Respiratory: Negative.      Physical Exam  BP 117/78   Pulse 81   Ht 5\' 5"  (1.651 m)   Wt 174 lb 12.8 oz (79.3 kg)   SpO2 97%   BMI 29.09 kg/m  Physical Exam Constitutional:      Appearance: Normal appearance.  HENT:     Head: Normocephalic and atraumatic.     Mouth/Throat:     Mouth: Mucous membranes are moist.     Pharynx: Oropharynx is clear.  Cardiovascular:     Rate and Rhythm: Normal rate and regular rhythm.  Pulmonary:     Effort: Pulmonary effort is normal.     Breath sounds: Normal breath sounds.  Musculoskeletal:        General: Normal range of motion.  Neurological:     General: No focal deficit present.     Mental Status: She is alert and oriented to person, place, and time. Mental status is at baseline.  Psychiatric:        Mood and Affect: Mood normal.        Behavior: Behavior normal.        Thought Content: Thought content normal.        Judgment: Judgment normal.     Lab Results:  CBC     Component Value Date/Time   WBC 7.5 02/18/2020 0948   RBC 4.82 02/18/2020 0948   HGB 14.0 02/18/2020 0948   HGB 12.9 12/11/2019 1435   HCT 42.7 02/18/2020 0948   PLT 327.0 02/18/2020 0948   PLT 344 12/11/2019 1435   MCV 88.6 02/18/2020 0948   MCH 28.4  12/11/2019 1435   MCHC 32.8 02/18/2020 0948   RDW 14.2 02/18/2020 0948   LYMPHSABS 1.8 02/18/2020 0948   MONOABS 0.9 02/18/2020 0948   EOSABS 0.3 02/18/2020 0948   BASOSABS 0.1 02/18/2020 0948    BMET    Component Value Date/Time   NA 138 12/11/2019 1435   K 4.0 12/11/2019 1435   CL 100 12/11/2019 1435   CO2 30 12/11/2019 1435   GLUCOSE 87 12/11/2019 1435   BUN 21 12/11/2019 1435   CREATININE 1.00 12/11/2019 1435   CALCIUM 9.3 12/11/2019 1435   GFRNONAA 59 (L) 12/11/2019 1435   GFRAA >60 12/11/2019 1435    BNP No results found for: BNP  ProBNP No results found for: PROBNP  Imaging: No results found.   Assessment & Plan:   Cough variant asthma - Treated for flare asthma symptoms with prednisone taper in June 2022. She is compliant with Symbicort 73mcg 1-2 times a day and Singulair 10mg  at bedtime. She needed to resume Protonix and Pepcid shortly after stopping as her cough returned.  Cough is currently well controlled on PPI/H2 blocker. No changes today, refills provided.  FU in 1 year with Dr. Melvyn Novas or sooner if needed    Martyn Ehrich, NP 11/06/2020

## 2020-12-09 ENCOUNTER — Ambulatory Visit: Payer: Medicare HMO | Admitting: Internal Medicine

## 2020-12-19 DIAGNOSIS — Z6829 Body mass index (BMI) 29.0-29.9, adult: Secondary | ICD-10-CM | POA: Diagnosis not present

## 2020-12-19 DIAGNOSIS — Z779 Other contact with and (suspected) exposures hazardous to health: Secondary | ICD-10-CM | POA: Diagnosis not present

## 2021-02-05 DIAGNOSIS — R7301 Impaired fasting glucose: Secondary | ICD-10-CM | POA: Diagnosis not present

## 2021-02-05 DIAGNOSIS — E7801 Familial hypercholesterolemia: Secondary | ICD-10-CM | POA: Diagnosis not present

## 2021-02-05 DIAGNOSIS — E039 Hypothyroidism, unspecified: Secondary | ICD-10-CM | POA: Diagnosis not present

## 2021-02-11 ENCOUNTER — Telehealth: Payer: Self-pay | Admitting: Internal Medicine

## 2021-02-11 MED ORDER — PREDNISONE 10 MG PO TABS: ORAL_TABLET | ORAL | 0 refills | Status: DC

## 2021-02-11 NOTE — Telephone Encounter (Signed)
Primary Pulmonologist: Wert  Last office visit and with whom: 11/06/20 BW What do we see them for (pulmonary problems):  Cough variant asthma Last OV assessment/plan: Cough variant asthma - Treated for flare asthma symptoms with prednisone taper in June 2022. She is compliant with Symbicort 8mcg 1-2 times a day and Singulair 10mg  at bedtime. She needed to resume Protonix and Pepcid shortly after stopping as her cough returned.  Cough is currently well controlled on PPI/H2 blocker. No changes today, refills provided.  FU in 1 year with Dr. Melvyn Novas or sooner if needed     Was appointment offered to patient (explain)?  Pt wanting advice.    Reason for call: Pt states productive cough with white thick mucus x 1 wk. Pt states taking Symbicort, Singulair, Protonix and Pepcid daily and using albuterol inhaler as needs as well as following Dr. Gustavus Bryant plan but, nothing has proved effective at this time. Pt has not tried any OTCs at this time. Pt states in past that Prednisone has helped in similar situations. Dr. Melvyn Novas please advise.    (examples of things to ask: : When did symptoms start? Fever? Cough? Productive? Color to sputum? More sputum than usual? Wheezing? Have you needed increased oxygen? Are you taking your respiratory medications? What over the counter measures have you tried?)  Allergies  Allergen Reactions   Macrobid [Nitrofurantoin Macrocrystal] Nausea Only and Other (See Comments)    "Made me deathly sick"   Lisinopril Nausea Only and Other (See Comments)    "Dizziness," also   Sulfa Antibiotics Hives    Immunization History  Administered Date(s) Administered   Influenza, Quadrivalent, Recombinant, Inj, Pf 01/26/2017, 02/01/2018   Influenza-Unspecified 01/27/2016, 03/26/2020   PFIZER(Purple Top)SARS-COV-2 Vaccination 06/09/2019, 07/04/2019, 01/22/2020

## 2021-02-11 NOTE — Telephone Encounter (Signed)
I have called the pt and she is aware of MW recs.  Prednisone has been sent to her pharmacy and she is aware.  Appt scheduled for her to see MW and to bring all meds in with her for 10/28.

## 2021-02-11 NOTE — Telephone Encounter (Signed)
Prednisone 10 mg take  4 each am x 2 days,   2 each am x 2 days,  1 each am x 2 days and stop but need to move up f/u to next opening for return pt and have her bring meds/ inhalers with her to regroup as just had to do this in June 2022 to get it back undercontrol and will need a more effective action plan going forward

## 2021-02-12 DIAGNOSIS — E039 Hypothyroidism, unspecified: Secondary | ICD-10-CM | POA: Diagnosis not present

## 2021-02-12 DIAGNOSIS — I10 Essential (primary) hypertension: Secondary | ICD-10-CM | POA: Diagnosis not present

## 2021-02-12 DIAGNOSIS — R7301 Impaired fasting glucose: Secondary | ICD-10-CM | POA: Diagnosis not present

## 2021-02-12 DIAGNOSIS — E7800 Pure hypercholesterolemia, unspecified: Secondary | ICD-10-CM | POA: Diagnosis not present

## 2021-02-20 ENCOUNTER — Encounter: Payer: Self-pay | Admitting: Internal Medicine

## 2021-02-20 ENCOUNTER — Other Ambulatory Visit: Payer: Self-pay

## 2021-02-20 ENCOUNTER — Ambulatory Visit: Payer: Medicare HMO | Admitting: Internal Medicine

## 2021-02-20 VITALS — BP 118/80 | HR 90 | Temp 98.5°F | Ht 64.0 in | Wt 175.0 lb

## 2021-02-20 DIAGNOSIS — K449 Diaphragmatic hernia without obstruction or gangrene: Secondary | ICD-10-CM | POA: Diagnosis not present

## 2021-02-20 DIAGNOSIS — J45991 Cough variant asthma: Secondary | ICD-10-CM

## 2021-02-20 DIAGNOSIS — R058 Other specified cough: Secondary | ICD-10-CM

## 2021-02-20 NOTE — Progress Notes (Signed)
Leslie Hines, female    DOB: February 25, 1951     MRN: [id number]   Brief patient profile:  6  yowf never smoked with cough/sob around 1978 eval by Dr Bernita Buffy  with dx asthma/allergies rx shots for 5 years some better but eventually placed on advair 250 bid with typical  flares about twice yearly and did fine until Oct 2019 due to oral irritation from advair  and left it off/ maintained on singulair but then flared in Jan 2020  And since restarting "prn" advair has had very difficult to control pattern of "cold and sore throat go to chest so referred to pulmonary clinic 09/08/2018 by Dr   Rex Kras with concern re:  freq pred rx needed to control.    History of Present Illness  09/08/2018  Pulmonary/ 1st office eval/Leslie Hines  Chief Complaint  Patient presents with   Pulm Consult    Referred by PCP for possible asthmatic bronchitis. Has had several flare ups over the past few years.   Dyspnea:  Not really  limited by breathing from desired activities   Cough: some throat clearing daytimes, some am cough/congestion slt yellow  Sleep: ok lying flat SABA use: twice a month rec For shortness of breath, cough, wheeze > Dulera 100 Take 2 puffs first thing in am and then another 2 puffs about 12 hours later.  Work on inhaler technique:  Only use your albuterol as a rescue medication    02/18/2020  f/u ov/Leslie Hines re: asthma / large [place]  - last 100% better  May 2021 not on maint dulera Chief Complaint  Patient presents with   Follow-up    Random coughing and wheezing  Dyspnea:  Unless coughing no doe  Cough: white yellow sporadic more day than noct with sensation of pnds Sleeping: bed is still flat/ 3 pillows SABA use: 3 x week it helps some, prednisone works better  02: none  rec Stop dulera  Plan A = Automatic = Always=    Symbicort 80 Take 2 puffs first thing in am and then another 2 puffs about 12 hours later.  Work on Orthoptist B = Backup (to supplement plan A, not to replace  it) Only use your albuterol inhaler as a rescue medication  Protonix 40 mg Take 30  min before your first and last meals of the day  GERD . Please remember to go to the lab department:    Eos 0.3 /  IgE  37 Late Add: Prednisone 10 mg take  4 each am x 2 days,   2 each am x 2 days,  1 each am x 2 days and stop    03/31/2020  f/u ov/Leslie Hines re: asthma / maint on symb 82 2bid / singulair /  Chief Complaint  Patient presents with   Follow-up    doing well, occ cough with clear mucous  Dyspnea:  Only if coughing  Cough: better on prednisone, some worse off it despite maint symb 80 2bid with acceptable technique Sleeping:  Bed is flat/ 3 pillows no bed blocks SABA use: once a week 02: none  rec Prednisone 10 mg take  4 each am x 2 days,   2 each am x 2 days,  1 each am x 2 days and stop to see if the throat clearing and the cough all resolve and whether they worse again on the low dose symbicort  Then the next refill for symbicort would be for the 160 strength.  06/09/2020  f/u ov/Leslie Hines re: symb 80 2bid/ singulair and ppi bid ac Chief Complaint  Patient presents with   Follow-up    Breathing is doing well and she is not coughing much at all. She rarely uses her albuterol inhaler.   Dyspnea:  Only when coughing  Cough: better now, tends to worst in August and winter  Sleeping: bed is flat/ sleeping on  SABA use: rarely  02: none  Covid status:   Max vax   02/20/2021  acute extended  ov/Leslie Hines re: asthma/ large HH   maint on symbicort 80 /  singulair ppi and h2hs  Chief Complaint  Patient presents with   Acute Visit    Pt c/o wheezing for the past 3 wks. She has also had cough with white sputum. She is using her albuterol inhaler several times per day.   Dyspnea:  MMRC1 = can walk nl pace, flat grade, can't hurry or go uphills or steps s sob   Basement to first floor was easy spring 2022 not now,   never pre rx with saba  Cough: worse for a couple of weeks, no better with prednisone /  lots of am congestion thick in am mucoid  Sleeping: Head of bed is not on blocks SABA use: not really helping cough, nor did prednisone/ not using symbicort as rec =  2 first thing in am  02: none  Covid status:  vax x 4    No obvious day to day or daytime variability or assoc excess/ purulent sputum or mucus plugs or hemoptysis or cp or chest tightness, subjective  overt sinus or hb symptoms.   sleep without nocturnal  or early am exacerbation  of respiratory  c/o's or need for noct saba. Also denies any obvious fluctuation of symptoms with weather or environmental changes or other aggravating or alleviating factors except as outlined above   No unusual exposure hx or h/o childhood pna/ asthma or knowledge of premature birth.  Current Allergies, Complete Past Medical History, Past Surgical History, Family History, and Social History were reviewed in [employer] record.  ROS  The following are not active complaints unless bolded Hoarseness, sore throat, dysphagia, dental problems, itching, sneezing,  nasal congestion or discharge of excess mucus or purulent secretions, ear ache,   fever, chills, sweats, unintended wt loss or wt gain, classically pleuritic or exertional cp,  orthopnea pnd or arm/hand swelling  or leg swelling, presyncope, palpitations, abdominal pain, anorexia, nausea, vomiting, diarrhea  or change in bowel habits or change in bladder habits, change in stools or change in urine, dysuria, hematuria,  rash, arthralgias, visual complaints, headache, numbness, weakness or ataxia or problems with walking or coordination,  change in mood or  memory.        Current Meds  Medication Sig   albuterol (PROVENTIL HFA;VENTOLIN HFA) 108 (90 Base) MCG/ACT inhaler Inhale 1-2 puffs into the lungs every 6 (six) hours as needed for wheezing or shortness of breath.    ALPRAZolam (XANAX) 0.5 MG tablet Take 0.5 mg by mouth daily as needed for anxiety.    aspirin EC 81 MG  tablet Take 162 mg by mouth once as needed (for onset of chest pain). Swallow whole.    budesonide-formoterol (SYMBICORT) 80-4.5 MCG/ACT inhaler Take 2 puffs first thing in am and then another 2 puffs about 12 hours later.   Dulaglutide (TRULICITY) 0.25 [place]/7.0WC SOPN ONE WEEKLY AS DIRECTED   estradiol (ESTRACE) 2 MG tablet Take 1 mg by  mouth daily.    famotidine (PEPCID) 20 MG tablet One after bfast and after supper   ferrous sulfate 325 (65 FE) MG tablet Take 325 mg by mouth daily with breakfast.   hydrochlorothiazide (HYDRODIURIL) 25 MG tablet Take 1 tablet (25 mg total) by mouth daily. NEED OV for further refills.   levothyroxine (SYNTHROID) 100 MCG tablet Take 100 mcg by mouth daily before breakfast.   magnesium 30 MG tablet Take 30 mg by mouth daily as needed (for leg cramps).    montelukast (SINGULAIR) 10 MG tablet Take 1 tablet (10 mg total) by mouth daily.   pantoprazole (PROTONIX) 20 MG tablet Take 1-2 tabs 30- 60 min before your first and last meals of the day   POTASSIUM PO Take 1 tablet by mouth daily as needed (for leg cramps).    pravastatin (PRAVACHOL) 40 MG tablet Take 40 mg by mouth at bedtime.   zolpidem (AMBIEN) 10 MG tablet Take 10 mg by mouth at bedtime.              Past Medical History:  Diagnosis Date   Anxiety    Arthritis    lt shoulder   Asthma    allergy related   Breast mass, left    Environmental allergies    Hypertension    Hypothyroidism       Objective:     02/20/2021      175  06/09/2020        179  03/31/2020        172   02/18/20 172 lb 9.6 oz (78.3 kg)  12/11/19 175 lb 1.9 oz (79.4 kg)  12/06/19 170 lb (77.1 kg)     Vital signs reviewed  02/20/2021  - Note at rest 02 sats  95% on RA   General appearance:    amb somber wf nad   HEENT : pt wearing mask not removed for exam due to covid -19 concerns.    NECK :  without JVD/Nodes/TM/ nl carotid upstrokes bilaterally   LUNGS: no acc muscle use,  Nl contour chest which is clear to A  and P bilaterally without cough on insp or exp maneuvers   CV:  RRR  no s3 or murmur or increase in P2, and no edema   ABD:  soft and nontender with nl inspiratory excursion in the supine position. No bruits or organomegaly appreciated, bowel sounds nl  MS:  Nl gait/ ext warm without deformities, calf tenderness, cyanosis or clubbing No obvious joint restrictions   SKIN: warm and dry without lesions    NEURO:  alert, approp, nl sensorium with  no motor or cerebellar deficits apparent.          Assessment

## 2021-02-20 NOTE — Assessment & Plan Note (Addendum)
Onset in 1970s and pos response to allergy rx x 5 y with Dr Bernita Buffy - 09/08/2018  After extensive coaching inhaler device,  effectiveness = 75% so try dulera 100 2bid prn flare / maint singulair  - Allergy profile 02/18/2020 >  Eos 0.3 /  IgE  37 -06/09/2020  After extensive coaching inhaler device,  effectiveness =    80% > continue symb 80 2bid and wean off ppi in favor of pepcid  11/06/2020 Cough returned off PPI, she has since resumed Protonix 20-40mg  and Pepcid 20mg  and cough is well controlled  - 02/20/2021  After extensive coaching inhaler device,  effectiveness =    75% from a baseline of 50% > continue symbicort 80 2bid   Asthma component is ok despite suboptimal hfa > no changes needed  If breathing an issue despite symbicort 80 used as directed = Take 2 puffs first thing in am and then another 2 puffs about 12 hours later.    then saba prn: Re SABA :  I spent extra time with pt today reviewing appropriate use of albuterol for prn use on exertion with the following points: 1) saba is for relief of sob that does not improve by walking a slower pace or resting but rather if the pt does not improve after trying this first. 2) If the pt is convinced, as many are, that saba helps recover from activity faster then it's easy to tell if this is the case by re-challenging : ie stop, take the inhaler, then p 5 minutes try the exact same activity (intensity of workload) that just caused the symptoms and see if they are substantially diminished or not after saba 3) if there is an activity that reproducibly causes the symptoms, try the saba 15 min before the activity on alternate days   If in fact the saba really does help, then fine to continue to use it prn but advised may need to look closer at the maintenance regimen being used to achieve better control of airways disease with exertion.

## 2021-02-20 NOTE — Assessment & Plan Note (Addendum)
Onset spring 2022 worse off gerd rx assoc with very large HH  - referred for surg eval for NIF 02/20/2021 >>>   Absence of convincing response to pred/ symbicort with absence of cough with deep insp/exp strongly suggestive of Upper airway cough syndrome (previously labeled PNDS),  is so named because it's frequently impossible to sort out how much is  CR/sinusitis with freq throat clearing (which can be related to primary GERD)   vs  causing  secondary (" extra esophageal")  GERD from wide swings in gastric pressure that occur with throat clearing, often  promoting self use of mint and menthol lozenges that reduce the lower esophageal sphincter tone and exacerbate the problem further in a cyclical fashion.   These are the same pts (now being labeled as having "irritable larynx syndrome" by some cough centers) who not infrequently have a history of having failed to tolerate ace inhibitors,  dry powder inhalers or biphosphonates or report having atypical/extraesophageal reflux symptoms that don't respond to standard doses of PPI  and are easily confused as having aecopd or asthma flares by even experienced allergists/ pulmonologists (myself included).   rec  Avoid dpi Max rx for gerd including bed blocks mucinex dm for cough Surgical evaluation for huge HH   Discussed in detail all the  indications, usual  risks and alternatives  relative to the benefits with patient who agrees to proceed with w/u as outlined.            Each maintenance medication was reviewed in detail including emphasizing most importantly the difference between maintenance and prns and under what circumstances the prns are to be triggered using an action plan format where appropriate.  Total time for H and P, chart review, counseling, reviewing hfa device(s) and generating customized AVS unique to this acute office visit for refractory symptoms since spring 2022  / same day charting = 42 min

## 2021-02-20 NOTE — Patient Instructions (Addendum)
Plan A = Automatic = Always=    Symbicort 80 Take 2 puffs first thing in am and then another 2 puffs about 12 hours later.    Work on inhaler technique:  relax and gently blow all the way out then take a nice smooth full deep breath back in, triggering the inhaler at same time you start breathing in.  Hold for up to 5 seconds if you can. Blow out thru nose. Rinse and gargle with water when done.  If mouth or throat bother you at all,  try brushing teeth/gums/tongue with arm and hammer toothpaste/ make a slurry and gargle and spit out.     Plan B = Backup (to supplement plan A, not to replace it) Only use your albuterol inhaler as a rescue medication to be used if you can't catch your breath by resting or doing a relaxed purse lip breathing pattern.  - The less you use it, the better it will work when you need it. - Ok to use the inhaler up to 2 puffs  every 4 hours if you must but call for appointment if use goes up over your usual need - Don't leave home without it !!  (think of it like the spare tire for your car)     Ok to try albuterol 15 min before an activity (on alternating days)  that you know would usually make you short of breath and see if it makes any difference and if makes none then don't take albuterol after activity unless you can't catch your breath as this means it's the resting that helps, not the albuterol.      For cough mucinex dm 1200 mg every 12  hours  6-8 in bed blocks   I will be referring you to Dr Kaylyn Lim about your hernia    Please schedule a follow up office visit in 6 weeks, call sooner if needed

## 2021-02-23 ENCOUNTER — Other Ambulatory Visit: Payer: Self-pay | Admitting: Obstetrics & Gynecology

## 2021-02-23 DIAGNOSIS — J45909 Unspecified asthma, uncomplicated: Secondary | ICD-10-CM | POA: Diagnosis not present

## 2021-02-23 DIAGNOSIS — Z0000 Encounter for general adult medical examination without abnormal findings: Secondary | ICD-10-CM | POA: Diagnosis not present

## 2021-02-23 DIAGNOSIS — D229 Melanocytic nevi, unspecified: Secondary | ICD-10-CM | POA: Diagnosis not present

## 2021-02-23 DIAGNOSIS — D509 Iron deficiency anemia, unspecified: Secondary | ICD-10-CM | POA: Diagnosis not present

## 2021-02-23 DIAGNOSIS — I10 Essential (primary) hypertension: Secondary | ICD-10-CM | POA: Diagnosis not present

## 2021-02-23 DIAGNOSIS — Z1231 Encounter for screening mammogram for malignant neoplasm of breast: Secondary | ICD-10-CM

## 2021-02-23 DIAGNOSIS — G8929 Other chronic pain: Secondary | ICD-10-CM | POA: Diagnosis not present

## 2021-02-23 DIAGNOSIS — E038 Other specified hypothyroidism: Secondary | ICD-10-CM | POA: Diagnosis not present

## 2021-02-23 DIAGNOSIS — E7800 Pure hypercholesterolemia, unspecified: Secondary | ICD-10-CM | POA: Diagnosis not present

## 2021-03-03 DIAGNOSIS — H5203 Hypermetropia, bilateral: Secondary | ICD-10-CM | POA: Diagnosis not present

## 2021-03-03 DIAGNOSIS — H524 Presbyopia: Secondary | ICD-10-CM | POA: Diagnosis not present

## 2021-03-03 DIAGNOSIS — H2513 Age-related nuclear cataract, bilateral: Secondary | ICD-10-CM | POA: Diagnosis not present

## 2021-03-13 ENCOUNTER — Other Ambulatory Visit: Payer: Self-pay | Admitting: Internal Medicine

## 2021-03-13 DIAGNOSIS — J45991 Cough variant asthma: Secondary | ICD-10-CM

## 2021-03-31 DIAGNOSIS — Z0100 Encounter for examination of eyes and vision without abnormal findings: Secondary | ICD-10-CM | POA: Diagnosis not present

## 2021-04-01 ENCOUNTER — Ambulatory Visit
Admission: RE | Admit: 2021-04-01 | Discharge: 2021-04-01 | Disposition: A | Discharge status: Home / Self Care | Payer: Medicare HMO | Source: Ambulatory Visit | Attending: Obstetrics & Gynecology | Admitting: Obstetrics & Gynecology

## 2021-04-01 ENCOUNTER — Other Ambulatory Visit: Payer: Self-pay

## 2021-04-01 DIAGNOSIS — Z1231 Encounter for screening mammogram for malignant neoplasm of breast: Secondary | ICD-10-CM

## 2021-04-03 ENCOUNTER — Other Ambulatory Visit: Payer: Self-pay

## 2021-04-03 ENCOUNTER — Ambulatory Visit: Payer: Medicare HMO | Admitting: Adult Health

## 2021-04-03 ENCOUNTER — Encounter: Payer: Self-pay | Admitting: Adult Health

## 2021-04-03 DIAGNOSIS — K219 Gastro-esophageal reflux disease without esophagitis: Secondary | ICD-10-CM | POA: Diagnosis not present

## 2021-04-03 DIAGNOSIS — K449 Diaphragmatic hernia without obstruction or gangrene: Secondary | ICD-10-CM

## 2021-04-03 DIAGNOSIS — J45991 Cough variant asthma: Secondary | ICD-10-CM | POA: Diagnosis not present

## 2021-04-03 MED ORDER — PANTOPRAZOLE SODIUM 40 MG PO TBEC: 40.0000 mg | DELAYED_RELEASE_TABLET | Freq: Two times a day (BID) | ORAL | 5 refills | Status: DC

## 2021-04-03 NOTE — Progress Notes (Signed)
@Patient  ID: Leslie Hines, female    DOB: May 17, 1950, 70 y.o.   MRN: [id number]  Chief Complaint  Patient presents with   Follow-up    Referring provider: Lenetta Quaker  HPI: 70 year old female never smoker seen for initial pulmonary consult Sep 08, 2018 for cough variant asthma  TEST/EVENTS :  Chest x-ray May 2020 showed clear lungs.  Moderate to large hiatal hernia  Allergy profile 02/18/2020 >  Eos 0.3 /  IgE  37  04/03/2021 Follow up : Cough variant asthma Patient presents for a 1 month follow-up.  Overall breathing is doing okay.  Feels she has been doing well over last month  Has some episodes of intermittent cough and congestion.  GERD has been managed relatively well with Protonix twice daily.  She says she would like a prescription for Protonix 40mg  instead of 20 mg. Previously taking Pepcid but has not needed to take that lately.  Last visit was referred to general surgery for hiatal hernia evaluation.  This has been set up for January.  Remains on Symbicort Twice daily, Singulair and Mucinex .  Influenza vaccine is up-to-date.  COVID-vaccine x1. Plans for COVID booster this weekend .  She denies any chest pain orthopnea PND or discolored mucus or fever.     Allergies  Allergen Reactions   Macrobid [Nitrofurantoin Macrocrystal] Nausea Only and Other (See Comments)    "Made me deathly sick"   Lisinopril Nausea Only and Other (See Comments)    "Dizziness," also   Sulfa Antibiotics Hives    Immunization History  Administered Date(s) Administered   Influenza, Quadrivalent, Recombinant, Inj, Pf 01/26/2017, 02/01/2018   Influenza-Unspecified 01/27/2016, 03/26/2020, 02/12/2021   PFIZER(Purple Top)SARS-COV-2 Vaccination 06/09/2019, 07/04/2019, 01/22/2020    Past Medical History:  Diagnosis Date   Anxiety    Arthritis    lt shoulder   Asthma    allergy related   Breast mass, left    Environmental allergies    Hypertension    Hypothyroidism      Tobacco History: Social History   Tobacco Use  Smoking Status Never  Smokeless Tobacco Never   Counseling given: Not Answered   Outpatient Medications Prior to Visit  Medication Sig Dispense Refill   albuterol (PROVENTIL HFA;VENTOLIN HFA) 108 (90 Base) MCG/ACT inhaler Inhale 1-2 puffs into the lungs every 6 (six) hours as needed for wheezing or shortness of breath.      ALPRAZolam (XANAX) 0.5 MG tablet Take 0.5 mg by mouth daily as needed for anxiety.      aspirin EC 81 MG tablet Take 162 mg by mouth once as needed (for onset of chest pain). Swallow whole.      budesonide-formoterol (SYMBICORT) 80-4.5 MCG/ACT inhaler TAKE 2 PUFFS FIRST THING IN AM AND THEN ANOTHER 2 PUFFS ABOUT 12 HOURS LATER. 10.2 each 12   Dulaglutide (TRULICITY) 8.18 EX/9.3ZJ SOPN ONE WEEKLY AS DIRECTED  changing to 3.0     estradiol (ESTRACE) 2 MG tablet Take 1 mg by mouth daily.      famotidine (PEPCID) 20 MG tablet One after bfast and after supper 60 tablet 11   ferrous sulfate 325 (65 FE) MG tablet Take 325 mg by mouth daily with breakfast.     hydrochlorothiazide (HYDRODIURIL) 25 MG tablet Take 1 tablet (25 mg total) by mouth daily. NEED OV for further refills. 15 tablet 0   levothyroxine (SYNTHROID) 100 MCG tablet Take 100 mcg by mouth daily before breakfast.     magnesium 30 MG tablet  Take 30 mg by mouth daily as needed (for leg cramps).      montelukast (SINGULAIR) 10 MG tablet Take 1 tablet (10 mg total) by mouth daily. 30 tablet 11   POTASSIUM PO Take 1 tablet by mouth daily as needed (for leg cramps).      pravastatin (PRAVACHOL) 40 MG tablet Take 40 mg by mouth at bedtime.     zolpidem (AMBIEN) 10 MG tablet Take 10 mg by mouth at bedtime.      pantoprazole (PROTONIX) 20 MG tablet Take 1-2 tabs 30- 60 min before your first and last meals of the day 60 tablet 5   No facility-administered medications prior to visit.     Review of Systems:   Constitutional:   No  weight loss, night sweats,  Fevers,  chills,  +fatigue, or  lassitude.  HEENT:   No headaches,  Difficulty swallowing,  Tooth/dental problems, or  Sore throat,                No sneezing, itching, ear ache, nasal congestion, post nasal drip,   CV:  No chest pain,  Orthopnea, PND, swelling in lower extremities, anasarca, dizziness, palpitations, syncope.   GI  No heartburn, indigestion, abdominal pain, nausea, vomiting, diarrhea, change in bowel habits, loss of appetite, bloody stools.   Resp: .  No chest wall deformity  Skin: no rash or lesions.  GU: no dysuria, change in color of urine, no urgency or frequency.  No flank pain, no hematuria   MS:  No joint pain or swelling.  No decreased range of motion.  No back pain.    Physical Exam  BP 102/72 (BP Location: Left Arm, Patient Position: Sitting, Cuff Size: Normal)   Pulse 98   Temp 98.1 F (36.7 C) (Oral)   Ht 5\' 5"  (1.651 m)   Wt 167 lb 9.6 oz (76 kg)   SpO2 98%   BMI 27.89 kg/m   GEN: A/Ox3; pleasant , NAD, well nourished    HEENT:  [place]/AT,   NOSE-clear, THROAT-clear, no lesions, no postnasal drip or exudate noted.   NECK:  Supple w/ fair ROM; no JVD; normal carotid impulses w/o bruits; no thyromegaly or nodules palpated; no lymphadenopathy.    RESP  Clear  P & A; w/o, wheezes/ rales/ or rhonchi. no accessory muscle use, no dullness to percussion  CARD:  RRR, no m/r/g, no peripheral edema, pulses intact, no cyanosis or clubbing.  GI:   Soft & nt; nml bowel sounds; no organomegaly or masses detected.   Musco: Warm bil, no deformities or joint swelling noted.   Neuro: alert, no focal deficits noted.    Skin: Warm, no lesions or rashes    Lab Results:  CBC     BNP No results found for: BNP  ProBNP No results found for: PROBNP  Imaging:     No flowsheet data found.  No results found for: NITRICOXIDE      Assessment & Plan:   Cough variant asthma Under good control continue on current regimen  Plan  Patient Instructions   Continue on Symbicort 2 puffs Twice daily  , rinse after use.  Albuterol inhaler As needed   Continue on Singulair 10mg  daily  Continue on Protonix Twice daily   GERD diet  Activity as tolerated.  Follow up with surgeon next month for Hiatal Hernia .  Follow up with Dr. Melvyn Novas  4 months and As needed     '   GERD (gastroesophageal reflux  disease) Continue on GERD diet  Continue on PPI   Plan  Patient Instructions  Continue on Symbicort 2 puffs Twice daily  , rinse after use.  Albuterol inhaler As needed   Continue on Singulair 10mg  daily  Continue on Protonix Twice daily   GERD diet  Activity as tolerated.  Follow up with surgeon next month for Hiatal Hernia .  Follow up with Dr. Melvyn Novas  4 months and As needed         Hiatal hernia General surgeon consult  is pending     Rexene Edison, NP 04/03/2021

## 2021-04-03 NOTE — Patient Instructions (Addendum)
Continue on Symbicort 2 puffs Twice daily  , rinse after use.  Albuterol inhaler As needed   Continue on Singulair 10mg  daily  Continue on Protonix Twice daily   GERD diet  Activity as tolerated.  Follow up with surgeon next month for Hiatal Hernia .  Follow up with Dr. Melvyn Novas  4 months and As needed

## 2021-04-03 NOTE — Assessment & Plan Note (Signed)
Under good control continue on current regimen  Plan  Patient Instructions  Continue on Symbicort 2 puffs Twice daily  , rinse after use.  Albuterol inhaler As needed   Continue on Singulair 10mg  daily  Continue on Protonix Twice daily   GERD diet  Activity as tolerated.  Follow up with surgeon next month for Hiatal Hernia .  Follow up with Dr. Melvyn Novas  4 months and As needed     '

## 2021-04-03 NOTE — Assessment & Plan Note (Signed)
Continue on GERD diet  Continue on PPI   Plan  Patient Instructions  Continue on Symbicort 2 puffs Twice daily  , rinse after use.  Albuterol inhaler As needed   Continue on Singulair 10mg  daily  Continue on Protonix Twice daily   GERD diet  Activity as tolerated.  Follow up with surgeon next month for Hiatal Hernia .  Follow up with Dr. Melvyn Novas  4 months and As needed

## 2021-04-03 NOTE — Assessment & Plan Note (Addendum)
General surgeon consult  is pending

## 2021-05-14 DIAGNOSIS — R7301 Impaired fasting glucose: Secondary | ICD-10-CM | POA: Diagnosis not present

## 2021-05-14 DIAGNOSIS — E7800 Pure hypercholesterolemia, unspecified: Secondary | ICD-10-CM | POA: Diagnosis not present

## 2021-05-14 DIAGNOSIS — E039 Hypothyroidism, unspecified: Secondary | ICD-10-CM | POA: Diagnosis not present

## 2021-05-14 DIAGNOSIS — Z23 Encounter for immunization: Secondary | ICD-10-CM | POA: Diagnosis not present

## 2021-06-03 DIAGNOSIS — D485 Neoplasm of uncertain behavior of skin: Secondary | ICD-10-CM | POA: Diagnosis not present

## 2021-06-03 DIAGNOSIS — D225 Melanocytic nevi of trunk: Secondary | ICD-10-CM | POA: Diagnosis not present

## 2021-06-03 DIAGNOSIS — L821 Other seborrheic keratosis: Secondary | ICD-10-CM | POA: Diagnosis not present

## 2021-06-03 DIAGNOSIS — L814 Other melanin hyperpigmentation: Secondary | ICD-10-CM | POA: Diagnosis not present

## 2021-06-03 DIAGNOSIS — L439 Lichen planus, unspecified: Secondary | ICD-10-CM | POA: Diagnosis not present

## 2021-06-03 DIAGNOSIS — L570 Actinic keratosis: Secondary | ICD-10-CM | POA: Diagnosis not present

## 2021-06-10 DIAGNOSIS — K449 Diaphragmatic hernia without obstruction or gangrene: Secondary | ICD-10-CM | POA: Diagnosis not present

## 2021-06-10 DIAGNOSIS — K219 Gastro-esophageal reflux disease without esophagitis: Secondary | ICD-10-CM | POA: Diagnosis not present

## 2021-06-16 ENCOUNTER — Other Ambulatory Visit: Payer: Self-pay | Admitting: Surgery

## 2021-06-16 DIAGNOSIS — K449 Diaphragmatic hernia without obstruction or gangrene: Secondary | ICD-10-CM

## 2021-06-16 DIAGNOSIS — K219 Gastro-esophageal reflux disease without esophagitis: Secondary | ICD-10-CM

## 2021-06-17 ENCOUNTER — Other Ambulatory Visit: Payer: Self-pay

## 2021-06-17 ENCOUNTER — Ambulatory Visit
Admission: RE | Admit: 2021-06-17 | Discharge: 2021-06-17 | Disposition: A | Discharge status: Home / Self Care | Payer: Medicare HMO | Source: Ambulatory Visit | Attending: Surgery | Admitting: Surgery

## 2021-06-17 DIAGNOSIS — R059 Cough, unspecified: Secondary | ICD-10-CM | POA: Diagnosis not present

## 2021-06-17 DIAGNOSIS — K222 Esophageal obstruction: Secondary | ICD-10-CM | POA: Diagnosis not present

## 2021-06-17 DIAGNOSIS — K449 Diaphragmatic hernia without obstruction or gangrene: Secondary | ICD-10-CM | POA: Diagnosis not present

## 2021-06-17 DIAGNOSIS — K219 Gastro-esophageal reflux disease without esophagitis: Secondary | ICD-10-CM

## 2021-07-16 DIAGNOSIS — L908 Other atrophic disorders of skin: Secondary | ICD-10-CM | POA: Diagnosis not present

## 2021-07-16 DIAGNOSIS — L570 Actinic keratosis: Secondary | ICD-10-CM | POA: Diagnosis not present

## 2021-09-03 DIAGNOSIS — J029 Acute pharyngitis, unspecified: Secondary | ICD-10-CM | POA: Diagnosis not present

## 2021-09-03 DIAGNOSIS — B950 Streptococcus, group A, as the cause of diseases classified elsewhere: Secondary | ICD-10-CM | POA: Diagnosis not present

## 2021-09-03 DIAGNOSIS — J040 Acute laryngitis: Secondary | ICD-10-CM | POA: Diagnosis not present

## 2021-09-03 DIAGNOSIS — R051 Acute cough: Secondary | ICD-10-CM | POA: Diagnosis not present

## 2021-09-11 DIAGNOSIS — H9202 Otalgia, left ear: Secondary | ICD-10-CM | POA: Diagnosis not present

## 2021-09-11 DIAGNOSIS — J45909 Unspecified asthma, uncomplicated: Secondary | ICD-10-CM | POA: Diagnosis not present

## 2021-09-11 DIAGNOSIS — J020 Streptococcal pharyngitis: Secondary | ICD-10-CM | POA: Diagnosis not present

## 2021-09-29 DIAGNOSIS — E7800 Pure hypercholesterolemia, unspecified: Secondary | ICD-10-CM | POA: Diagnosis not present

## 2021-09-29 DIAGNOSIS — R7301 Impaired fasting glucose: Secondary | ICD-10-CM | POA: Diagnosis not present

## 2021-09-29 DIAGNOSIS — E039 Hypothyroidism, unspecified: Secondary | ICD-10-CM | POA: Diagnosis not present

## 2021-09-30 DIAGNOSIS — K449 Diaphragmatic hernia without obstruction or gangrene: Secondary | ICD-10-CM | POA: Diagnosis not present

## 2021-10-06 DIAGNOSIS — E039 Hypothyroidism, unspecified: Secondary | ICD-10-CM | POA: Diagnosis not present

## 2021-10-06 DIAGNOSIS — R7301 Impaired fasting glucose: Secondary | ICD-10-CM | POA: Diagnosis not present

## 2021-10-06 DIAGNOSIS — E7800 Pure hypercholesterolemia, unspecified: Secondary | ICD-10-CM | POA: Diagnosis not present

## 2021-10-06 DIAGNOSIS — I10 Essential (primary) hypertension: Secondary | ICD-10-CM | POA: Diagnosis not present

## 2021-10-28 DIAGNOSIS — Z0100 Encounter for examination of eyes and vision without abnormal findings: Secondary | ICD-10-CM | POA: Diagnosis not present

## 2021-10-28 NOTE — Progress Notes (Signed)
Sent message, via epic in basket, requesting order in epic from surgeon     10/28/21 1035  Preop Orders  Has preop orders? No  Name of staff/physician contacted for orders(Indicate phone or IB message) M. Hassell Done, MD.

## 2021-11-04 NOTE — Progress Notes (Addendum)
COVID Vaccine Completed: yes x4  Date of COVID positive in last 90 days: no  PCP - Marda Stalker, PA Cardiologist - Dr. Gwenlyn Found 2017  Chest x-ray - n/a EKG - 11/05/21 Epic/chart Stress Test - 10/24/15 Epic ECHO - 11/07/15 Epic Cardiac Cath - n/a Pacemaker/ICD device last checked: n/a Spinal Cord Stimulator: n/a  Bowel Prep - no  Sleep Study - n/a CPAP -   Fasting Blood Sugar - n/a Checks Blood Sugar _____ times a day  Blood Thinner Instructions: n/a Aspirin Instructions:  Last Dose:  Activity level: Can go up a flight of stairs and perform activities of daily living without stopping and without symptoms of chest pain or shortness of breath.   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 Patient was also instructed that they will need to review over the PAT instructions again at home before surgery.

## 2021-11-04 NOTE — Patient Instructions (Signed)
DUE TO COVID-19 ONLY TWO VISITORS  (aged 69 and older)  ARE ALLOWED TO COME WITH YOU AND STAY IN THE WAITING ROOM ONLY DURING PRE OP AND PROCEDURE.   **NO VISITORS ARE ALLOWED IN THE SHORT STAY AREA OR RECOVERY ROOM!!**  IF YOU WILL BE ADMITTED INTO THE HOSPITAL YOU ARE ALLOWED ONLY FOUR SUPPORT PEOPLE DURING VISITATION HOURS ONLY (7 AM -8PM)   The support person(s) must pass our screening, gel in and out, and wear a mask at all times, including in the patient's room. Patients must also wear a mask when staff or their support person are in the room. Visitors GUEST BADGE MUST BE WORN VISIBLY  One adult visitor may remain with you overnight and MUST be in the room by 8 P.M.     Your procedure is scheduled on: 11/09/21   Report to [facility] Main Entrance    Report to admitting at 5:15 AM   Call this number if you have problems the morning of surgery [phone]   Do not eat food :After Midnight.   After Midnight you may have the following liquids until 4:15 AM DAY OF SURGERY  Water Black Coffee (sugar ok, NO MILK/CREAM OR CREAMERS)  Tea (sugar ok, NO MILK/CREAM OR CREAMERS) regular and decaf                             Plain Jell-O (NO RED)                                           Fruit ices (not with fruit pulp, NO RED)                                     Popsicles (NO RED)                                                                  Juice: apple, WHITE grape, WHITE cranberry Sports drinks like Gatorade (NO RED) Clear broth(vegetable,chicken,beef)  FOLLOW BOWEL PREP AND ANY ADDITIONAL PRE OP INSTRUCTIONS YOU RECEIVED FROM YOUR SURGEON'S OFFICE!!!     Oral Hygiene is also important to reduce your risk of infection.                                    Remember - BRUSH YOUR TEETH THE MORNING OF SURGERY WITH YOUR REGULAR TOOTHPASTE   Take these medicines the morning of surgery with A SIP OF WATER: Xanax, Inhalers, Pepcid, Levothyroxine, Singulair, Pantoprazole                               You may not have any metal on your body including hair pins, jewelry, and body piercing             Do not wear make-up, lotions, powders, perfumes, or deodorant  Do not wear nail polish including gel and S&S, artificial/acrylic nails, or any  other type of covering on natural nails including finger and toenails. If you have artificial nails, gel coating, etc. that needs to be removed by a nail salon please have this removed prior to surgery or surgery may need to be canceled/ delayed if the surgeon/ anesthesia feels like they are unable to be safely monitored.   Do not shave  48 hours prior to surgery.    Do not bring valuables to the hospital. [place].   Bring small overnight bag day of surgery.   DO NOT [place]. PHARMACY WILL DISPENSE MEDICATIONS LISTED ON YOUR MEDICATION LIST TO YOU DURING YOUR ADMISSION [place]!               Please read over the following fact sheets you were given: IF YOU HAVE QUESTIONS ABOUT YOUR PRE-OP INSTRUCTIONS PLEASE CALL [place] - Preparing for Surgery Before surgery, you can play an important role.  Because skin is not sterile, your skin needs to be as free of germs as possible.  You can reduce the number of germs on your skin by washing with CHG (chlorahexidine gluconate) soap before surgery.  CHG is an antiseptic cleaner which kills germs and bonds with the skin to continue killing germs even after washing. Please DO NOT use if you have an allergy to CHG or antibacterial soaps.  If your skin becomes reddened/irritated stop using the CHG and inform your nurse when you arrive at Short Stay. Do not shave (including legs and underarms) for at least 48 hours prior to the first CHG shower.  You may shave your face/neck.  Please follow these instructions carefully:  1.  Shower with CHG Soap the night before surgery and the   morning of surgery.  2.  If you choose to wash your hair, wash your hair first as usual with your normal  shampoo.  3.  After you shampoo, rinse your hair and body thoroughly to remove the shampoo.                             4.  Use CHG as you would any other liquid soap.  You can apply chg directly to the skin and wash.  Gently with a scrungie or clean washcloth.  5.  Apply the CHG Soap to your body ONLY FROM THE NECK DOWN.   Do   not use on face/ open                           Wound or open sores. Avoid contact with eyes, ears mouth and   genitals (private parts).                       Wash face,  Genitals (private parts) with your normal soap.             6.  Wash thoroughly, paying special attention to the area where your    surgery  will be performed.  7.  Thoroughly rinse your body with warm water from the neck down.  8.  DO NOT shower/wash with your normal soap after using and rinsing off the CHG Soap.  9.  Pat yourself dry with a clean towel.            10.  Wear clean pajamas.            11.  Place clean sheets on your bed the night of your first shower and do not  sleep with pets. Day of Surgery : Do not apply any lotions/deodorants the morning of surgery.  Please wear clean clothes to the hospital/surgery center.  FAILURE TO FOLLOW THESE INSTRUCTIONS MAY RESULT IN THE CANCELLATION OF YOUR SURGERY  PATIENT SIGNATURE_________________________________  NURSE SIGNATURE__________________________________  ________________________________________________________________________

## 2021-11-04 NOTE — Progress Notes (Signed)
Please place orders for PAT appointment scheduled 11/05/21.

## 2021-11-05 ENCOUNTER — Encounter (HOSPITAL_COMMUNITY): Payer: Self-pay

## 2021-11-05 ENCOUNTER — Encounter (HOSPITAL_COMMUNITY)
Admission: RE | Admit: 2021-11-05 | Discharge: 2021-11-05 | Disposition: A | Discharge status: Home / Self Care | Payer: Medicare HMO | Source: Ambulatory Visit | Attending: Surgery | Admitting: Surgery

## 2021-11-05 VITALS — BP 128/88 | HR 84 | Temp 97.8°F | Resp 10 | Ht 64.5 in | Wt 155.4 lb

## 2021-11-05 DIAGNOSIS — Z01818 Encounter for other preprocedural examination: Secondary | ICD-10-CM | POA: Insufficient documentation

## 2021-11-05 DIAGNOSIS — I10 Essential (primary) hypertension: Secondary | ICD-10-CM | POA: Diagnosis not present

## 2021-11-05 HISTORY — DX: Anemia, unspecified: D64.9

## 2021-11-05 HISTORY — DX: Gastro-esophageal reflux disease without esophagitis: K21.9

## 2021-11-05 LAB — BASIC METABOLIC PANEL WITH GFR
Anion gap: 6 (ref 5–15)
BUN: 16 mg/dL (ref 8–23)
CO2: 26 mmol/L (ref 22–32)
Calcium: 9.4 mg/dL (ref 8.9–10.3)
Chloride: 108 mmol/L (ref 98–111)
Creatinine, Ser: 0.9 mg/dL (ref 0.44–1.00)
GFR, Estimated: >60 mL/min
Glucose, Bld: 100 mg/dL — ABNORMAL HIGH (ref 70–99)
Potassium: 3.8 mmol/L (ref 3.5–5.1)
Sodium: 140 mmol/L (ref 135–145)

## 2021-11-05 LAB — CBC
HCT: 43.1 % (ref 36.0–46.0)
Hemoglobin: 14.2 g/dL (ref 12.0–15.0)
MCH: 29.5 pg (ref 26.0–34.0)
MCHC: 32.9 g/dL (ref 30.0–36.0)
MCV: 89.6 fL (ref 80.0–100.0)
Platelets: 355 10*3/uL (ref 150–400)
RBC: 4.81 MIL/uL (ref 3.87–5.11)
RDW: 14.2 % (ref 11.5–15.5)
WBC: 5.8 10*3/uL (ref 4.0–10.5)
nRBC: 0 % (ref 0.0–0.2)

## 2021-11-07 NOTE — H&P (Signed)
PROVIDER: Joya San, MD  MRN: [id number] DOB: Mar 28, 1951 Subjective   Chief Complaint: Follow-up (UGI)  History of Present Illness: Leslie Hines is a 71 y.o. female who is seen today for follow-up review of her upper GI series that was done February 22. This shows about three quarters of her stomach is in her chest. She has a lot of impingement upon her breathing and was referred by Clois Comber for consideration of hiatal hernia repair. Today I went over her upper GI series and showed it to her and her in its entirety and attempted to answer her questions. She is done her homework and seems to have a good fund of knowledge about this procedure. We talked about the limitations of the diaphragmatic closure and her inability to use permanent mesh. If we needed mesh would be a biologic mesh.  She has been thinking about this quite a bit and wants to move forward. We will go ahead and schedule this at [facility] either on the robot or laparoscopically. She has a vacation planned in August..  I did give her a hiatal hernia repair pamphlet and annotated it with diagrams suggesting her much larger hiatal hernia and how it would be repaired  Review of Systems: See HPI as well for other ROS.  ROS   Medical History: Past Medical History:  Diagnosis Date  Anxiety  Asthma, unspecified asthma severity, unspecified whether complicated, unspecified whether persistent  GERD (gastroesophageal reflux disease)   Patient Active Problem List  Diagnosis  Abnormal EKG  Chest pain  Asthma  Cough variant asthma  Essential hypertension  GERD (gastroesophageal reflux disease)  Hiatal hernia  History of adenomatous polyp of colon  Hypothyroid  Iron deficiency anemia, unspecified  Pure hypercholesterolemia  Upper airway cough syndrome   Past Surgical History:  Procedure Laterality Date  HYSTERECTOMY  MASTECTOMY PARTIAL / LUMPECTOMY    Allergies  Allergen Reactions  Lisinopril Nausea, Other  (See Comments) and Syncope  "Dizziness" Other reaction(s): Other (See Comments) "Dizziness" "Dizziness" "Dizziness," also  Sulfa (Sulfonamide Antibiotics) Hives  Other reaction(s): Hives ([id number])  Nitrofurantoin Monohyd/M-Cryst Other (See Comments)   Current Outpatient Medications on File Prior to Visit  Medication Sig Dispense Refill  ALPRAZolam (XANAX) 0.5 MG tablet 1 tablet  budesonide-formoteroL (SYMBICORT) 80-4.5 mcg/actuation inhaler TAKE 2 PUFFS FIRST THING IN AM AND THEN ANOTHER 2 PUFFS ABOUT 12 HOURS LATER.  estradioL (ESTRACE) 2 MG tablet 1 tablet  famotidine (PEPCID) 20 MG tablet One after bfast and after supper  hydroCHLOROthiazide (HYDRODIURIL) 25 MG tablet 1 TABLET ORALLY EVERY DAY 90 DAYS  levothyroxine (SYNTHROID) 100 MCG tablet TAKE 1 TABLET BY MOUTH IN THE MORNING ON EMPTY STOMACH DAILY FOR 30 DAYS  montelukast (SINGULAIR) 10 mg tablet 1 tablet in the evening  pantoprazole (PROTONIX) 20 MG DR tablet TAKE 1-2 TABS 30- 60 MIN BEFORE YOUR FIRST AND LAST MEALS OF THE DAY  pravastatin (PRAVACHOL) 40 MG tablet Take 40 mg by mouth at bedtime   No current facility-administered medications on file prior to visit.   Family History  Problem Relation Age of Onset  High blood pressure (Hypertension) Mother  Coronary Artery Disease (Blocked arteries around heart) Father    Social History   Tobacco Use  Smoking Status Never  Smokeless Tobacco Never    Social History   Socioeconomic History  Marital status: Divorced  Tobacco Use  Smoking status: Never  Smokeless tobacco: Never  Vaping Use  Vaping Use: Never used  Substance and Sexual Activity  Alcohol  use: Yes  Drug use: Not Currently   Objective:   Vitals:  Weight: 72 kg (158 lb 12.8 oz)  Height: 162.6 cm ('5\' 4"'$ )   Body mass index is 27.26 kg/m.  Physical Exam General: Well maintained white female no acute distress HEENT: Unremarkable. Neck no bruits Chest : Chest some expiratory wheezes are  present. Heart: Sinus rhythm Breast: Not performed but Nedra Hai has done a left breast biopsy on her in the past Abdomen: Nontender GU not performed Rectal not performed Extremities full range of motion no history of DVT Neuro alert and oriented x3. Motor and sensory function grossly intact  Labs, Imaging and Diagnostic Testing:  Reviewed her upper GI series with her--large hiatal hernia  Assessment and Plan:   Hiatal hernia-large for takedown and repair    Large type III mixed hiatal hernia. I discussed laparoscopic/robotic repair with either partial or complete fundoplication  No follow-ups on file.  Sariyah Corcino Donia Pounds, MD

## 2021-11-08 NOTE — Anesthesia Preprocedure Evaluation (Addendum)
Anesthesia Evaluation  Patient identified by MRN, date of birth, ID band Patient awake    Reviewed: Allergy & Precautions, NPO status , Patient's Chart, lab work & pertinent test results  History of Anesthesia Complications Negative for: history of anesthetic complications  Airway Mallampati: III  TM Distance: >3 FB Neck ROM: Full    Dental no notable dental hx. (+) Dental Advisory Given   Pulmonary asthma ,    Pulmonary exam normal        Cardiovascular hypertension, Pt. on medications Normal cardiovascular exam  Normal EST and echo in 2017   Neuro/Psych PSYCHIATRIC DISORDERS Anxiety negative neurological ROS     GI/Hepatic Neg liver ROS, hiatal hernia, GERD  Medicated,  Endo/Other  Hypothyroidism   Renal/GU negative Renal ROS     Musculoskeletal negative musculoskeletal ROS (+)   Abdominal   Peds  Hematology negative hematology ROS (+)   Anesthesia Other Findings   Reproductive/Obstetrics                            Anesthesia Physical Anesthesia Plan  ASA: 2  Anesthesia Plan: General   Post-op Pain Management: Tylenol PO (pre-op)*, Lidocaine infusion* and Ketamine IV*   Induction: Intravenous, Rapid sequence and Cricoid pressure planned  PONV Risk Score and Plan: 4 or greater and Ondansetron, Dexamethasone, Midazolam and Scopolamine patch - Pre-op  Airway Management Planned: Oral ETT  Additional Equipment:   Intra-op Plan:   Post-operative Plan: Extubation in OR  Informed Consent: I have reviewed the patients History and Physical, chart, labs and discussed the procedure including the risks, benefits and alternatives for the proposed anesthesia with the patient or authorized representative who has indicated his/her understanding and acceptance.     Dental advisory given  Plan Discussed with: Anesthesiologist and CRNA  Anesthesia Plan Comments:      Anesthesia Quick  Evaluation

## 2021-11-09 ENCOUNTER — Ambulatory Visit (HOSPITAL_COMMUNITY): Payer: Medicare HMO | Admitting: Certified Registered Nurse Anesthetist

## 2021-11-09 ENCOUNTER — Ambulatory Visit (HOSPITAL_BASED_OUTPATIENT_CLINIC_OR_DEPARTMENT_OTHER): Payer: Medicare HMO | Admitting: Certified Registered Nurse Anesthetist

## 2021-11-09 ENCOUNTER — Other Ambulatory Visit: Payer: Self-pay

## 2021-11-09 ENCOUNTER — Encounter (HOSPITAL_COMMUNITY)
Admission: RE | Disposition: A | Discharge status: Home / Self Care | Payer: Self-pay | Source: Ambulatory Visit | Attending: Surgery

## 2021-11-09 ENCOUNTER — Ambulatory Visit (HOSPITAL_COMMUNITY)
Admission: RE | Admit: 2021-11-09 | Discharge: 2021-11-10 | Disposition: A | Discharge status: Home / Self Care | Payer: Medicare HMO | Source: Ambulatory Visit | Attending: Surgery | Admitting: Surgery

## 2021-11-09 ENCOUNTER — Encounter (HOSPITAL_COMMUNITY): Payer: Self-pay | Admitting: Surgery

## 2021-11-09 DIAGNOSIS — K449 Diaphragmatic hernia without obstruction or gangrene: Secondary | ICD-10-CM

## 2021-11-09 DIAGNOSIS — J45909 Unspecified asthma, uncomplicated: Secondary | ICD-10-CM | POA: Insufficient documentation

## 2021-11-09 DIAGNOSIS — I10 Essential (primary) hypertension: Secondary | ICD-10-CM | POA: Insufficient documentation

## 2021-11-09 DIAGNOSIS — R69 Illness, unspecified: Secondary | ICD-10-CM | POA: Diagnosis not present

## 2021-11-09 DIAGNOSIS — Z79899 Other long term (current) drug therapy: Secondary | ICD-10-CM | POA: Diagnosis not present

## 2021-11-09 DIAGNOSIS — F419 Anxiety disorder, unspecified: Secondary | ICD-10-CM | POA: Insufficient documentation

## 2021-11-09 DIAGNOSIS — E039 Hypothyroidism, unspecified: Secondary | ICD-10-CM | POA: Diagnosis not present

## 2021-11-09 DIAGNOSIS — Z8719 Personal history of other diseases of the digestive system: Secondary | ICD-10-CM

## 2021-11-09 DIAGNOSIS — K219 Gastro-esophageal reflux disease without esophagitis: Secondary | ICD-10-CM | POA: Diagnosis not present

## 2021-11-09 DIAGNOSIS — Z8249 Family history of ischemic heart disease and other diseases of the circulatory system: Secondary | ICD-10-CM | POA: Diagnosis not present

## 2021-11-09 HISTORY — PX: HIATAL HERNIA REPAIR: SHX195

## 2021-11-09 LAB — CBC
HCT: 40.2 % (ref 36.0–46.0)
Hemoglobin: 13.3 g/dL (ref 12.0–15.0)
MCH: 30.1 pg (ref 26.0–34.0)
MCHC: 33.1 g/dL (ref 30.0–36.0)
MCV: 91 fL (ref 80.0–100.0)
Platelets: 344 10*3/uL (ref 150–400)
RBC: 4.42 MIL/uL (ref 3.87–5.11)
RDW: 13.9 % (ref 11.5–15.5)
WBC: 13.8 10*3/uL — ABNORMAL HIGH (ref 4.0–10.5)
nRBC: 0 % (ref 0.0–0.2)

## 2021-11-09 LAB — CREATININE, SERUM
Creatinine, Ser: 0.93 mg/dL (ref 0.44–1.00)
GFR, Estimated: >60 mL/min (ref >60)

## 2021-11-09 SURGERY — REPAIR, HERNIA, HIATAL, LAPAROSCOPIC
Anesthesia: General

## 2021-11-09 MED ORDER — SCOPOLAMINE 1 MG/3DAYS TD PT72SCOPOLAMINE 1 MG/3DAYS
1.0000 | MEDICATED_PATCH | TRANSDERMAL | Status: DC
Start: 2021-11-09 — End: 2021-11-09
  Administered 2021-11-09: 1.5 mg via TRANSDERMAL
  Filled 2021-11-09: qty 1

## 2021-11-09 MED ORDER — PROMETHAZINE HCL 25 MG/ML IJ SOLN: 6.2500 mg | INTRAMUSCULAR | Status: DC | PRN

## 2021-11-09 MED ORDER — CEFAZOLIN SODIUM-DEXTROSE 2-4 GM/100ML-% IV SOLN
2.0000 g | INTRAVENOUS | Status: AC
  Administered 2021-11-09: 2 g via INTRAVENOUS
  Filled 2021-11-09: qty 100

## 2021-11-09 MED ORDER — KETAMINE HCL 50 MG/5ML IJ SOSY
PREFILLED_SYRINGE | INTRAMUSCULAR | Status: AC
Start: 2021-11-09 — End: ?
  Filled 2021-11-09: qty 5

## 2021-11-09 MED ORDER — KCL IN DEXTROSE-NACL 20-5-0.45 MEQ/L-%-% IV SOLN
INTRAVENOUS | Status: DC
Start: 2021-11-09 — End: 2021-11-10
  Filled 2021-11-09 (×2): qty 1000

## 2021-11-09 MED ORDER — AMISULPRIDE (ANTIEMETIC) 5 MG/2ML IV SOLN: 10.0000 mg | Freq: Once | INTRAVENOUS | Status: DC | PRN

## 2021-11-09 MED ORDER — CHLORHEXIDINE GLUCONATE CLOTH 2 % EX PADS: 6.0000 | MEDICATED_PAD | Freq: Once | CUTANEOUS | Status: DC

## 2021-11-09 MED ORDER — PROPOFOL 10 MG/ML IV BOLUS
INTRAVENOUS | Status: DC | PRN
  Administered 2021-11-09: 150 mg via INTRAVENOUS

## 2021-11-09 MED ORDER — ALBUTEROL SULFATE (2.5 MG/3ML) 0.083% IN NEBU: 2.5000 mg | INHALATION_SOLUTION | Freq: Four times a day (QID) | RESPIRATORY_TRACT | Status: DC | PRN

## 2021-11-09 MED ORDER — DEXAMETHASONE SODIUM PHOSPHATE 10 MG/ML IJ SOLN
INTRAMUSCULAR | Status: AC
  Filled 2021-11-09: qty 1

## 2021-11-09 MED ORDER — STERILE WATER FOR IRRIGATION IR SOLN
Status: DC | PRN
  Administered 2021-11-09: 500 mL

## 2021-11-09 MED ORDER — SODIUM CHLORIDE (PF) 0.9 % IJ SOLN
INTRAMUSCULAR | Status: AC
  Filled 2021-11-09: qty 10

## 2021-11-09 MED ORDER — LIDOCAINE HCL (PF) 2 % IJ SOLN
INTRAMUSCULAR | Status: AC
  Filled 2021-11-09: qty 5

## 2021-11-09 MED ORDER — CEFAZOLIN SODIUM-DEXTROSE 2-4 GM/100ML-% IV SOLN
2.0000 g | Freq: Three times a day (TID) | INTRAVENOUS | Status: AC
  Administered 2021-11-09: 2 g via INTRAVENOUS
  Filled 2021-11-09: qty 100

## 2021-11-09 MED ORDER — ONDANSETRON HCL 4 MG/2ML IJ SOLN
INTRAMUSCULAR | Status: DC | PRN
  Administered 2021-11-09: 4 mg via INTRAVENOUS

## 2021-11-09 MED ORDER — FENTANYL CITRATE PF 50 MCG/ML IJ SOSY
12.5000 ug | PREFILLED_SYRINGE | INTRAMUSCULAR | Status: DC | PRN
  Administered 2021-11-09 (×2): 12.5 ug via INTRAVENOUS
  Filled 2021-11-09 (×2): qty 1

## 2021-11-09 MED ORDER — EPHEDRINE 5 MG/ML INJ
INTRAVENOUS | Status: AC
Start: 2021-11-09 — End: ?
  Filled 2021-11-09: qty 5

## 2021-11-09 MED ORDER — CHLORHEXIDINE GLUCONATE 0.12 % MT SOLN
15.0000 mL | Freq: Once | OROMUCOSAL | Status: AC
  Administered 2021-11-09: 15 mL via OROMUCOSAL

## 2021-11-09 MED ORDER — BUPIVACAINE LIPOSOME 1.3 % IJ SUSP
INTRAMUSCULAR | Status: AC
  Filled 2021-11-09: qty 20

## 2021-11-09 MED ORDER — ONDANSETRON HCL 4 MG/2ML IJ SOLN
INTRAMUSCULAR | Status: AC
  Filled 2021-11-09: qty 2

## 2021-11-09 MED ORDER — SUGAMMADEX SODIUM 200 MG/2ML IV SOLN
INTRAVENOUS | Status: DC | PRN
  Administered 2021-11-09: 200 mg via INTRAVENOUS

## 2021-11-09 MED ORDER — MIDAZOLAM HCL 2 MG/2ML IJ SOLN
INTRAMUSCULAR | Status: AC
  Filled 2021-11-09: qty 2

## 2021-11-09 MED ORDER — DEXAMETHASONE SODIUM PHOSPHATE 10 MG/ML IJ SOLN
INTRAMUSCULAR | Status: DC | PRN
  Administered 2021-11-09: 10 mg via INTRAVENOUS

## 2021-11-09 MED ORDER — EPHEDRINE SULFATE-NACL 50-0.9 MG/10ML-% IV SOSY
PREFILLED_SYRINGE | INTRAVENOUS | Status: DC | PRN
  Administered 2021-11-09 (×2): 5 mg via INTRAVENOUS

## 2021-11-09 MED ORDER — PANTOPRAZOLE SODIUM 40 MG IV SOLR
40.0000 mg | Freq: Every day | INTRAVENOUS | Status: DC
  Administered 2021-11-09: 40 mg via INTRAVENOUS
  Filled 2021-11-09: qty 10

## 2021-11-09 MED ORDER — PROCHLORPERAZINE EDISYLATE 10 MG/2ML IJ SOLN: 5.0000 mg | Freq: Four times a day (QID) | INTRAMUSCULAR | Status: DC | PRN

## 2021-11-09 MED ORDER — LACTATED RINGERS IV SOLN: INTRAVENOUS | Status: DC

## 2021-11-09 MED ORDER — 0.9 % SODIUM CHLORIDE (POUR BTL) OPTIME
TOPICAL | Status: DC | PRN
  Administered 2021-11-09: 1000 mL

## 2021-11-09 MED ORDER — LIDOCAINE 2% (20 MG/ML) 5 ML SYRINGE
INTRAMUSCULAR | Status: DC | PRN
  Administered 2021-11-09: 1.5 mg/kg/h via INTRAVENOUS
  Administered 2021-11-09: 100 mg via INTRAVENOUS

## 2021-11-09 MED ORDER — SODIUM CHLORIDE (PF) 0.9 % IJ SOLN
INTRAMUSCULAR | Status: DC | PRN
  Administered 2021-11-09: 10 mL

## 2021-11-09 MED ORDER — HYDROCODONE-ACETAMINOPHEN 5-325 MG PO TABS
1.0000 | ORAL_TABLET | ORAL | Status: DC | PRN
  Administered 2021-11-09 – 2021-11-10 (×3): 2 via ORAL
  Filled 2021-11-09 (×3): qty 2

## 2021-11-09 MED ORDER — ARTIFICIAL TEARS OPHTHALMIC OINT
TOPICAL_OINTMENT | OPHTHALMIC | Status: AC
  Filled 2021-11-09: qty 3.5

## 2021-11-09 MED ORDER — MOMETASONE FURO-FORMOTEROL FUM 100-5 MCG/ACT IN AERO
2.0000 | INHALATION_SPRAY | Freq: Two times a day (BID) | RESPIRATORY_TRACT | Status: DC
  Administered 2021-11-09 – 2021-11-10 (×2): 2 via RESPIRATORY_TRACT
  Filled 2021-11-09: qty 8.8

## 2021-11-09 MED ORDER — ROCURONIUM BROMIDE 10 MG/ML (PF) SYRINGE
PREFILLED_SYRINGE | INTRAVENOUS | Status: AC
Start: 2021-11-09 — End: ?
  Filled 2021-11-09: qty 10

## 2021-11-09 MED ORDER — ROCURONIUM BROMIDE 10 MG/ML (PF) SYRINGE
PREFILLED_SYRINGE | INTRAVENOUS | Status: DC | PRN
  Administered 2021-11-09: 20 mg via INTRAVENOUS
  Administered 2021-11-09: 80 mg via INTRAVENOUS
  Administered 2021-11-09: 20 mg via INTRAVENOUS
  Administered 2021-11-09: 10 mg via INTRAVENOUS

## 2021-11-09 MED ORDER — ACETAMINOPHEN 500 MG PO TABS
1000.0000 mg | ORAL_TABLET | Freq: Once | ORAL | Status: AC
  Administered 2021-11-09: 1000 mg via ORAL
  Filled 2021-11-09: qty 2

## 2021-11-09 MED ORDER — ONDANSETRON HCL 4 MG/2ML IJ SOLN: 4.0000 mg | Freq: Four times a day (QID) | INTRAMUSCULAR | Status: DC | PRN

## 2021-11-09 MED ORDER — PHENYLEPHRINE HCL-NACL 20-0.9 MG/250ML-% IV SOLN
INTRAVENOUS | Status: DC | PRN
  Administered 2021-11-09: 35 ug/min via INTRAVENOUS

## 2021-11-09 MED ORDER — PROCHLORPERAZINE MALEATE 10 MG PO TABS: 10.0000 mg | ORAL_TABLET | Freq: Four times a day (QID) | ORAL | Status: DC | PRN

## 2021-11-09 MED ORDER — KETAMINE HCL 10 MG/ML IJ SOLN
INTRAMUSCULAR | Status: DC | PRN
  Administered 2021-11-09: 30 mg via INTRAVENOUS
  Administered 2021-11-09: 20 mg via INTRAVENOUS

## 2021-11-09 MED ORDER — HEPARIN SODIUM (PORCINE) 5000 UNIT/ML IJ SOLN
5000.0000 [IU] | Freq: Three times a day (TID) | INTRAMUSCULAR | Status: DC
  Administered 2021-11-09 – 2021-11-10 (×3): 5000 [IU] via SUBCUTANEOUS
  Filled 2021-11-09 (×3): qty 1

## 2021-11-09 MED ORDER — BUPIVACAINE LIPOSOME 1.3 % IJ SUSP: 20.0000 mL | Freq: Once | INTRAMUSCULAR | Status: DC

## 2021-11-09 MED ORDER — ORAL CARE MOUTH RINSE: 15.0000 mL | Freq: Once | OROMUCOSAL | Status: AC

## 2021-11-09 MED ORDER — FENTANYL CITRATE PF 50 MCG/ML IJ SOSY
PREFILLED_SYRINGE | INTRAMUSCULAR | Status: AC
  Filled 2021-11-09: qty 1

## 2021-11-09 MED ORDER — MIDAZOLAM HCL 5 MG/5ML IJ SOLN
INTRAMUSCULAR | Status: DC | PRN
  Administered 2021-11-09: 2 mg via INTRAVENOUS

## 2021-11-09 MED ORDER — FENTANYL CITRATE (PF) 250 MCG/5ML IJ SOLN
INTRAMUSCULAR | Status: AC
  Filled 2021-11-09: qty 5

## 2021-11-09 MED ORDER — ONDANSETRON 4 MG PO TBDP: 4.0000 mg | ORAL_TABLET | Freq: Four times a day (QID) | ORAL | Status: DC | PRN

## 2021-11-09 MED ORDER — FENTANYL CITRATE PF 50 MCG/ML IJ SOSY
25.0000 ug | PREFILLED_SYRINGE | INTRAMUSCULAR | Status: DC | PRN
  Administered 2021-11-09: 50 ug via INTRAVENOUS

## 2021-11-09 MED ORDER — BUPIVACAINE LIPOSOME 1.3 % IJ SUSP
INTRAMUSCULAR | Status: DC | PRN
  Administered 2021-11-09: 20 mL

## 2021-11-09 MED ORDER — SCOPOLAMINE 1 MG/3DAYS TD PT72: 1.0000 | MEDICATED_PATCH | TRANSDERMAL | Status: DC

## 2021-11-09 MED ORDER — METOPROLOL TARTRATE 5 MG/5ML IV SOLN: 5.0000 mg | Freq: Four times a day (QID) | INTRAVENOUS | Status: DC | PRN

## 2021-11-09 MED ORDER — FENTANYL CITRATE (PF) 250 MCG/5ML IJ SOLN
INTRAMUSCULAR | Status: DC | PRN
  Administered 2021-11-09 (×2): 50 ug via INTRAVENOUS
  Administered 2021-11-09: 100 ug via INTRAVENOUS
  Administered 2021-11-09: 50 ug via INTRAVENOUS

## 2021-11-09 MED ORDER — LIDOCAINE HCL 2 % IJ SOLN
INTRAMUSCULAR | Status: AC
  Filled 2021-11-09: qty 20

## 2021-11-09 SURGICAL SUPPLY — 59 items
ADH SKN CLS APL DERMABOND .7 (GAUZE/BANDAGES/DRESSINGS) ×1
APPLIER CLIP ROT 10 11.4 M/L (STAPLE)
APR CLP MED LRG 11.4X10 (STAPLE)
BAG COUNTER SPONGE SURGICOUNT (BAG) IMPLANT
BAG SPNG CNTER NS LX DISP (BAG)
BLADE SURG 15 STRL LF DISP TIS (BLADE) ×2 IMPLANT
BLADE SURG 15 STRL SS (BLADE) ×2
CABLE HIGH FREQUENCY MONO STRZ (ELECTRODE) IMPLANT
CLAMP ENDO BABCK 10MM (STAPLE) IMPLANT
CLIP APPLIE ROT 10 11.4 M/L (STAPLE) IMPLANT
COVER SURGICAL LIGHT HANDLE (MISCELLANEOUS) ×3 IMPLANT
DERMABOND ADVANCED (GAUZE/BANDAGES/DRESSINGS) ×1
DERMABOND ADVANCED .7 DNX12 (GAUZE/BANDAGES/DRESSINGS) ×2 IMPLANT
DEVICE SUT QUICK LOAD TK 5 (SUTURE) ×10 IMPLANT
DEVICE SUT TI-KNOT TK 5X26 (SUTURE) ×1 IMPLANT
DEVICE SUTURE ENDOST 10MM (ENDOMECHANICALS) ×3 IMPLANT
DISSECTOR BLUNT TIP ENDO 5MM (MISCELLANEOUS) ×3 IMPLANT
DRAIN PENROSE 0.5X18 (DRAIN) ×3 IMPLANT
ELECT L-HOOK LAP 45CM DISP (ELECTROSURGICAL)
ELECT REM PT RETURN 15FT ADLT (MISCELLANEOUS) ×3 IMPLANT
ELECTRODE L-HOOK LAP 45CM DISP (ELECTROSURGICAL) IMPLANT
FELT TEFLON 4 X1 (Mesh General) ×1 IMPLANT
GAUZE 4X4 16PLY ~~LOC~~+RFID DBL (SPONGE) ×3 IMPLANT
GLOVE SURG LX 8.0 MICRO (GLOVE) ×1
GLOVE SURG LX STRL 8.0 MICRO (GLOVE) ×2 IMPLANT
GOWN STRL REUS W/ TWL XL LVL3 (GOWN DISPOSABLE) ×6 IMPLANT
GOWN STRL REUS W/TWL XL LVL3 (GOWN DISPOSABLE) ×6
GRASPER ENDO BABCOCK 10 (MISCELLANEOUS) IMPLANT
GRASPER ENDO BABCOCK 10MM (MISCELLANEOUS)
IRRIG SUCT STRYKERFLOW 2 WTIP (MISCELLANEOUS) ×2
IRRIGATION SUCT STRKRFLW 2 WTP (MISCELLANEOUS) ×2 IMPLANT
KIT BASIN OR (CUSTOM PROCEDURE TRAY) ×3 IMPLANT
KIT TURNOVER KIT A (KITS) IMPLANT
MESH BIO-A 7X10 SYN MAT (Mesh General) ×1 IMPLANT
NEEDLE HYPO 22GX1.5 SAFETY (NEEDLE) ×3 IMPLANT
PACK CARDIOVASCULAR III (CUSTOM PROCEDURE TRAY) ×3 IMPLANT
SCISSORS LAP 5X45 EPIX DISP (ENDOMECHANICALS) ×3 IMPLANT
SET TUBE SMOKE EVAC HIGH FLOW (TUBING) ×3 IMPLANT
SHEARS HARMONIC ACE PLUS 45CM (MISCELLANEOUS) ×3 IMPLANT
SLEEVE ADV FIXATION 5X100MM (TROCAR) ×6 IMPLANT
SPIKE FLUID TRANSFER (MISCELLANEOUS) ×3 IMPLANT
STAPLER VISISTAT 35W (STAPLE) ×3 IMPLANT
SUT MNCRL AB 4-0 PS2 18 (SUTURE) ×2 IMPLANT
SUT SURGIDAC NAB ES-9 0 48 120 (SUTURE) ×17 IMPLANT
SUT VIC AB 4-0 SH 18 (SUTURE) IMPLANT
SUT VICRYL 0 UR6 27IN ABS (SUTURE) ×1 IMPLANT
SYR 20ML LL LF (SYRINGE) ×3 IMPLANT
TAPE CLOTH 4X10 WHT NS (GAUZE/BANDAGES/DRESSINGS) IMPLANT
TIP INNERVISION DETACH 40FR (MISCELLANEOUS) IMPLANT
TIP INNERVISION DETACH 50FR (MISCELLANEOUS) IMPLANT
TIP INNERVISION DETACH 56FR (MISCELLANEOUS) IMPLANT
TIPS INNERVISION DETACH 40FR (MISCELLANEOUS)
TOWEL OR 17X26 10 PK STRL BLUE (TOWEL DISPOSABLE) ×3 IMPLANT
TOWEL OR NON WOVEN STRL DISP B (DISPOSABLE) ×3 IMPLANT
TRAY FOLEY MTR SLVR 16FR STAT (SET/KITS/TRAYS/PACK) IMPLANT
TRAY LAPAROSCOPIC (CUSTOM PROCEDURE TRAY) ×3 IMPLANT
TROCAR 11X100 Z THREAD (TROCAR) ×3 IMPLANT
TROCAR ADV FIXATION 5X100MM (TROCAR) ×3 IMPLANT
TROCAR Z-THREAD OPTICAL 5X100M (TROCAR) ×3 IMPLANT

## 2021-11-09 NOTE — Anesthesia Postprocedure Evaluation (Signed)
Anesthesia Post Note  Patient: MICKALA LATON  Procedure(s) Performed: LAPAROSCOPIC REPAIR OF LARGE HIATAL HERNIA  WITH MESH AND NISSEN FUNDOPLICATION, UPPER GI ENDOSCOPY     Patient location during evaluation: PACU Anesthesia Type: General Level of consciousness: sedated Pain management: pain level controlled Vital Signs Assessment: post-procedure vital signs reviewed and stable Respiratory status: spontaneous breathing and respiratory function stable Cardiovascular status: stable Postop Assessment: no apparent nausea or vomiting Anesthetic complications: no   No notable events documented.  Last Vitals:  Vitals:   11/09/21 1204 11/09/21 1308  BP: (!) 135/94 135/90  Pulse: 81 82  Resp: 18 20  Temp: 37 C   SpO2: 96% 96%    Last Pain:  Vitals:   11/09/21 1303  TempSrc:   PainSc: 6                  Teara Duerksen DANIEL

## 2021-11-09 NOTE — Op Note (Signed)
ZIKERIA KEOUGH  [birth year]   11/09/2021    PCP:  Marda Stalker, PA-C   Surgeon: Kaylyn Lim, MD, FACS  Asst:  Louanna Raw, MD  Anes:  general  Preop Dx: Large type III mixed hiatal hernia Postop Dx: same  Procedure: Laparoscopic takedown of hiatal hernia, endoscopy, diaphragm closure with three sutures including one with pledgets(middle suture); Nissen fundoplication over a 56 lighted bougie Location Surgery: [facility] 2 Complications: None noted  EBL:   minimal cc  Drains: none  Description of Procedure:  The patient was taken to OR 2 .  After anesthesia was administered and the patient was prepped  with chloroprep  and a timeout was performed.  Access to the abdomen was achieved with a 5 mm Optiview through the left upper quadrant.  I 12 was placed to the left the umbilicus and using that port I took down the all the adhesions to her previous umbilical hernia repair with the harmonic.  To assist ports, camera port and another on the far right side were all 5 mm were placed.  Block was applied by both sides in the upper abdominal region and a tap fashion and later 1 in the upper midline for the [facility] retractor.  The [place] retractor was inserted and an retracted left lateral segment revealing a very large defect  Starting on the right side we incised the edge of the diaphragm and moved anteriorly across to the left side.  We reached into grab the sac and tease it away from the surrounding tissue eventually removing both the sac on the right side and the left side in toto and posteriorly.  We freed up the esophagus from its surrounding tissue.  Dr. Thermon Leyland inserted the endoscope and we verified the EG junction below the diaphragm.  The hiatus was then approximated beginning posteriorly with interrupted 0 Surgidek's with tie knots.  The second ball pledgets and then there were 2 other sutures to approximate the hiatus.  We checked the closure with the 56 dilator which was  passed into the stomach.  I top that up applied a piece of biologic mesh in a reverse C fashion and sutured it in 3 places to anchor it both on the 2 on the right side and then 1 up on the diaphragm in the left lateral segment.  This buttressed the  closure of the hiatus.  A 360 degree Nissen wrap was then performed after freeing was short gastrics and getting a mobile portion of the cardia and fundus was brought around behind it and wrapped anteriorly with 3 sutures with sutures to the esophagus and these were secured with tie knots as well.  Wrap look good and the 56 lighted bougie was withdrawn.  Traction was removed and we surveyed the abdomen there were no other issues noted.  Wounds were closed with 4-0 Monocryl and Dermabond.    The patient tolerated the procedure well and was taken to the PACU in stable condition.     Matt B. Hassell Done, [place], [facility], [place]

## 2021-11-09 NOTE — Interval H&P Note (Signed)
History and Physical Interval Note:  11/09/2021 7:17 AM  Leslie Hines  has presented today for surgery, with the diagnosis of III mixed hiatal hernia repair.  The various methods of treatment have been discussed with the patient and family. After consideration of risks, benefits and other options for treatment, the patient has consented to  Procedure(s): [place] (N/A) as a surgical intervention.  The patient's history has been reviewed, patient examined, no change in status, stable for surgery.  I have reviewed the patient's chart and labs.  Questions were answered to the patient's satisfaction.     [name]

## 2021-11-09 NOTE — Plan of Care (Signed)
Problem: Education: Goal: Knowledge of General Education information will improve Description: Including pain rating scale, medication(s)/side effects and non-pharmacologic comfort measures Outcome: Progressing   Problem: Health Behavior/Discharge Planning: Goal: Ability to manage health-related needs will improve Outcome: Progressing   Problem: Clinical Measurements: Goal: Ability to maintain clinical measurements within normal limits will improve Outcome: Progressing   Problem: Activity: Goal: Risk for activity intolerance will decrease Outcome: Progressing   Problem: Pain Managment: Goal: General experience of comfort will improve Outcome: Progressing  Ivan Anchors, RN 11/09/21 6:23 PM

## 2021-11-09 NOTE — Progress Notes (Signed)
RT educated pt on MDI w/spacer. Pt also educated on proper technique to ensure proper deposition of medication to have better relief of asthma symptoms. Pt able to perform w/out difficulty. Pt verbalizes understanding. RT will continue to monitor.

## 2021-11-09 NOTE — Transfer of Care (Signed)
Immediate Anesthesia Transfer of Care Note  Patient: Leslie Hines  Procedure(s) Performed: Procedure(s): LAPAROSCOPIC REPAIR OF LARGE HIATAL HERNIA  WITH MESH AND NISSEN FUNDOPLICATION, UPPER GI ENDOSCOPY (N/A)  Patient Location: PACU  Anesthesia Type:General  Level of Consciousness:  sedated, patient cooperative and responds to stimulation  Airway & Oxygen Therapy:Patient Spontanous Breathing and Patient connected to face mask oxgen  Post-op Assessment:  Report given to PACU RN and Post -op Vital signs reviewed and stable  Post vital signs:  Reviewed and stable  Last Vitals:  Vitals:   11/09/21 0552  BP: 118/75  Pulse: 84  Resp: 16  Temp: 36.8 C  SpO2: 47%    Complications: No apparent anesthesia complications

## 2021-11-09 NOTE — Anesthesia Procedure Notes (Addendum)
Procedure Name: Intubation Date/Time: 11/09/2021 7:31 AM  Performed by: Maxwell Caul, CRNAPre-anesthesia Checklist: Patient identified, Emergency Drugs available, Suction available and Patient being monitored Patient Re-evaluated:Patient Re-evaluated prior to induction Oxygen Delivery Method: Circle system utilized Preoxygenation: Pre-oxygenation with 100% oxygen Induction Type: IV induction, Rapid sequence and Cricoid Pressure applied Laryngoscope Size: Mac and 4 Grade View: Grade I Tube type: Oral Tube size: 7.0 mm Number of attempts: 1 Airway Equipment and Method: Stylet Placement Confirmation: ETT inserted through vocal cords under direct vision, positive ETCO2 and breath sounds checked- equal and bilateral Secured at: 22 cm Tube secured with: Tape Dental Injury: Teeth and Oropharynx as per pre-operative assessment

## 2021-11-10 ENCOUNTER — Encounter (HOSPITAL_COMMUNITY): Payer: Self-pay | Admitting: Surgery

## 2021-11-10 ENCOUNTER — Other Ambulatory Visit: Payer: Self-pay | Admitting: Primary Care

## 2021-11-10 DIAGNOSIS — K219 Gastro-esophageal reflux disease without esophagitis: Secondary | ICD-10-CM | POA: Diagnosis not present

## 2021-11-10 DIAGNOSIS — Z79899 Other long term (current) drug therapy: Secondary | ICD-10-CM | POA: Diagnosis not present

## 2021-11-10 DIAGNOSIS — K449 Diaphragmatic hernia without obstruction or gangrene: Secondary | ICD-10-CM | POA: Diagnosis not present

## 2021-11-10 DIAGNOSIS — Z8249 Family history of ischemic heart disease and other diseases of the circulatory system: Secondary | ICD-10-CM | POA: Diagnosis not present

## 2021-11-10 DIAGNOSIS — I10 Essential (primary) hypertension: Secondary | ICD-10-CM | POA: Diagnosis not present

## 2021-11-10 DIAGNOSIS — R69 Illness, unspecified: Secondary | ICD-10-CM | POA: Diagnosis not present

## 2021-11-10 DIAGNOSIS — J45909 Unspecified asthma, uncomplicated: Secondary | ICD-10-CM | POA: Diagnosis not present

## 2021-11-10 DIAGNOSIS — E039 Hypothyroidism, unspecified: Secondary | ICD-10-CM | POA: Diagnosis not present

## 2021-11-10 LAB — CBC
HCT: 39.3 % (ref 36.0–46.0)
Hemoglobin: 12.6 g/dL (ref 12.0–15.0)
MCH: 29.2 pg (ref 26.0–34.0)
MCHC: 32.1 g/dL (ref 30.0–36.0)
MCV: 91 fL (ref 80.0–100.0)
Platelets: 371 10*3/uL (ref 150–400)
RBC: 4.32 MIL/uL (ref 3.87–5.11)
RDW: 13.9 % (ref 11.5–15.5)
WBC: 11.1 10*3/uL — ABNORMAL HIGH (ref 4.0–10.5)
nRBC: 0 % (ref 0.0–0.2)

## 2021-11-10 LAB — BASIC METABOLIC PANEL
Anion gap: 8 (ref 5–15)
BUN: 9 mg/dL (ref 8–23)
CO2: 27 mmol/L (ref 22–32)
Calcium: 8.9 mg/dL (ref 8.9–10.3)
Chloride: 105 mmol/L (ref 98–111)
Creatinine, Ser: 0.87 mg/dL (ref 0.44–1.00)
GFR, Estimated: >60 mL/min (ref >60)
Glucose, Bld: 130 mg/dL — ABNORMAL HIGH (ref 70–99)
Potassium: 3.9 mmol/L (ref 3.5–5.1)
Sodium: 140 mmol/L (ref 135–145)

## 2021-11-10 MED ORDER — HYDROCODONE-ACETAMINOPHEN 5-325 MG PO TABS: 1.0000 | ORAL_TABLET | ORAL | 0 refills | Status: DC | PRN

## 2021-11-10 MED ORDER — ONDANSETRON 4 MG PO TBDP: 4.0000 mg | ORAL_TABLET | Freq: Four times a day (QID) | ORAL | 0 refills | Status: AC | PRN

## 2021-11-10 NOTE — Discharge Summary (Signed)
Physician Discharge Summary  Patient ID: Leslie Hines MRN: [id number] DOB/AGE: 1950-08-29 71 y.o.  PCP: Marda Stalker, PA-C  Admit date: 11/09/2021 Discharge date: 11/10/2021  Admission Diagnoses:  large hiatal hernia with GERD  Discharge Diagnoses:  same  Principal Problem:   History of repair of hiatal hernia   Surgery:  laparoscopic repair of hiatal hernia with biomesh buttress, Nissen fundoplication  Discharged Condition: improved  Hospital Course:   Had surgery on Monday.  Begun on clear liquids.  Labs OK.  Tolerated liquids and discharged on full liquids.  No sore throat  Consults: none  Significant Diagnostic Studies: none    Discharge Exam: Blood pressure 109/81, pulse 83, temperature 97.6 F (36.4 C), temperature source Oral, resp. rate 18, height 5' 4.5" (1.638 m), weight 70.9 kg, SpO2 98 %. Incisions bland  Disposition: Discharge disposition: 01-Home or Self Care       Discharge Instructions     Call MD for:  redness, tenderness, or signs of infection (pain, swelling, redness, odor or green/yellow discharge around incision site)   Complete by: As directed    Diet full liquid   Complete by: As directed    Discharge instructions   Complete by: As directed    Shower when you get home Full liquids for 1 week and then pureed foods for 3 weeks   Increase activity slowly   Complete by: As directed    No wound care   Complete by: As directed       Allergies as of 11/10/2021       Reactions   Macrobid [nitrofurantoin Macrocrystal] Nausea Only, Other (See Comments)   "Made me deathly sick"   Lisinopril Nausea Only, Other (See Comments)   "Dizziness," also   Sulfa Antibiotics Hives        Medication List     TAKE these medications    albuterol 108 (90 Base) MCG/ACT inhaler Commonly known as: VENTOLIN HFA Inhale 1-2 puffs into the lungs every 6 (six) hours as needed for wheezing or shortness of breath.   ALPRAZolam 0.5 MG tablet Commonly  known as: XANAX Take 0.5 mg by mouth daily as needed for anxiety.   aspirin EC 81 MG tablet Take 81 mg by mouth every 6 (six) hours as needed (chest pain). Swallow whole.   budesonide-formoterol 80-4.5 MCG/ACT inhaler Commonly known as: Symbicort TAKE 2 PUFFS FIRST THING IN AM AND THEN ANOTHER 2 PUFFS ABOUT 12 HOURS LATER. What changed:  how much to take how to take this when to take this reasons to take this additional instructions   estradiol 2 MG tablet Commonly known as: ESTRACE Take 2 mg by mouth daily.   famotidine 20 MG tablet Commonly known as: Pepcid One after bfast and after supper What changed:  how much to take how to take this when to take this reasons to take this additional instructions   hydrochlorothiazide 25 MG tablet Commonly known as: HYDRODIURIL Take 1 tablet (25 mg total) by mouth daily. NEED OV for further refills. What changed:  how much to take additional instructions   HYDROcodone-acetaminophen 5-325 MG tablet Commonly known as: NORCO/VICODIN Take 1-2 tablets by mouth every 4 (four) hours as needed for moderate pain.   levothyroxine 100 MCG tablet Commonly known as: SYNTHROID Take 100 mcg by mouth daily before breakfast.   montelukast 10 MG tablet Commonly known as: SINGULAIR Take 1 tablet (10 mg total) by mouth daily.   ondansetron 4 MG disintegrating tablet Commonly known as: ZOFRAN-ODT Take  1 tablet (4 mg total) by mouth every 6 (six) hours as needed for nausea.   pantoprazole 40 MG tablet Commonly known as: Protonix Take 1 tablet (40 mg total) by mouth 2 (two) times daily before a meal.   POTASSIUM PO Take 1 tablet by mouth daily as needed (for leg cramps).   pravastatin 40 MG tablet Commonly known as: PRAVACHOL Take 40 mg by mouth at bedtime.   zolpidem 10 MG tablet Commonly known as: AMBIEN Take 10 mg by mouth at bedtime.        Follow-up Information     Johnathan Hausen, MD. Schedule an appointment as soon as  possible for a visit in 3 week(s).   Specialty: General Surgery Why: For routine follow up with Dr. Carter Kitten information: [place] [id number] [phone]                 Signed: [name] 11/10/2021, 10:53 AM

## 2021-11-10 NOTE — Plan of Care (Signed)
  Problem: Education: Goal: Knowledge of General Education information will improve Description: Including pain rating scale, medication(s)/side effects and non-pharmacologic comfort measures Outcome: Progressing   Problem: Activity: Goal: Risk for activity intolerance will decrease Outcome: Progressing   Problem: Nutrition: Goal: Adequate nutrition will be maintained Outcome: Progressing   

## 2021-12-25 ENCOUNTER — Other Ambulatory Visit: Payer: Self-pay | Admitting: Primary Care

## 2022-02-08 DIAGNOSIS — Z01419 Encounter for gynecological examination (general) (routine) without abnormal findings: Secondary | ICD-10-CM | POA: Diagnosis not present

## 2022-02-08 DIAGNOSIS — Z6825 Body mass index (BMI) 25.0-25.9, adult: Secondary | ICD-10-CM | POA: Diagnosis not present

## 2022-02-08 DIAGNOSIS — Z760 Encounter for issue of repeat prescription: Secondary | ICD-10-CM | POA: Diagnosis not present

## 2022-02-08 DIAGNOSIS — Z1272 Encounter for screening for malignant neoplasm of vagina: Secondary | ICD-10-CM | POA: Diagnosis not present

## 2022-03-05 DIAGNOSIS — D509 Iron deficiency anemia, unspecified: Secondary | ICD-10-CM | POA: Diagnosis not present

## 2022-03-05 DIAGNOSIS — Z0000 Encounter for general adult medical examination without abnormal findings: Secondary | ICD-10-CM | POA: Diagnosis not present

## 2022-03-05 DIAGNOSIS — J45909 Unspecified asthma, uncomplicated: Secondary | ICD-10-CM | POA: Diagnosis not present

## 2022-03-05 DIAGNOSIS — I10 Essential (primary) hypertension: Secondary | ICD-10-CM | POA: Diagnosis not present

## 2022-03-05 DIAGNOSIS — E038 Other specified hypothyroidism: Secondary | ICD-10-CM | POA: Diagnosis not present

## 2022-03-05 DIAGNOSIS — J029 Acute pharyngitis, unspecified: Secondary | ICD-10-CM | POA: Diagnosis not present

## 2022-03-05 DIAGNOSIS — E7800 Pure hypercholesterolemia, unspecified: Secondary | ICD-10-CM | POA: Diagnosis not present

## 2022-03-09 ENCOUNTER — Other Ambulatory Visit: Payer: Self-pay | Admitting: Internal Medicine

## 2022-03-09 DIAGNOSIS — H2513 Age-related nuclear cataract, bilateral: Secondary | ICD-10-CM | POA: Diagnosis not present

## 2022-03-09 DIAGNOSIS — H524 Presbyopia: Secondary | ICD-10-CM | POA: Diagnosis not present

## 2022-03-09 DIAGNOSIS — H5203 Hypermetropia, bilateral: Secondary | ICD-10-CM | POA: Diagnosis not present

## 2022-03-31 DIAGNOSIS — E7800 Pure hypercholesterolemia, unspecified: Secondary | ICD-10-CM | POA: Diagnosis not present

## 2022-03-31 DIAGNOSIS — E039 Hypothyroidism, unspecified: Secondary | ICD-10-CM | POA: Diagnosis not present

## 2022-03-31 DIAGNOSIS — R7301 Impaired fasting glucose: Secondary | ICD-10-CM | POA: Diagnosis not present

## 2022-04-07 DIAGNOSIS — K449 Diaphragmatic hernia without obstruction or gangrene: Secondary | ICD-10-CM | POA: Diagnosis not present

## 2022-04-07 DIAGNOSIS — E039 Hypothyroidism, unspecified: Secondary | ICD-10-CM | POA: Diagnosis not present

## 2022-04-07 DIAGNOSIS — R7301 Impaired fasting glucose: Secondary | ICD-10-CM | POA: Diagnosis not present

## 2022-04-07 DIAGNOSIS — I10 Essential (primary) hypertension: Secondary | ICD-10-CM | POA: Diagnosis not present

## 2022-04-07 DIAGNOSIS — E7800 Pure hypercholesterolemia, unspecified: Secondary | ICD-10-CM | POA: Diagnosis not present

## 2022-04-08 ENCOUNTER — Other Ambulatory Visit: Payer: Self-pay | Admitting: Internal Medicine

## 2022-04-12 ENCOUNTER — Other Ambulatory Visit: Payer: Self-pay | Admitting: Adult Health

## 2022-04-12 ENCOUNTER — Other Ambulatory Visit: Payer: Self-pay | Admitting: Primary Care

## 2022-04-16 ENCOUNTER — Other Ambulatory Visit: Payer: Self-pay | Admitting: Internal Medicine

## 2022-04-16 DIAGNOSIS — J45991 Cough variant asthma: Secondary | ICD-10-CM

## 2022-05-11 ENCOUNTER — Other Ambulatory Visit: Payer: Self-pay | Admitting: Internal Medicine

## 2022-05-30 NOTE — Progress Notes (Unsigned)
Leslie Hines, female    DOB: 07/17/1950     MRN: [id number]   Brief patient profile:  63 yowf never smoked with cough/sob around 1978 eval by Dr Bernita Buffy  with dx asthma/allergies rx shots for 5 years some better but eventually placed on advair 250 bid with typical  flares about twice yearly and did fine until Oct 2019 due to oral irritation from advair  and left it off/ maintained on singulair but then flared in Jan 2020  And since restarting "prn" advair has had very difficult to control pattern of "cold and sore throat go to chest so referred to pulmonary clinic 09/08/2018 by Dr   Rex Kras with concern re:  freq pred rx needed to control.    History of Present Illness  09/08/2018  Pulmonary/ 1st office eval/Leslie Hines  Chief Complaint  Patient presents with   Pulm Consult    Referred by PCP for possible asthmatic bronchitis. Has had several flare ups over the past few years.   Dyspnea:  Not really  limited by breathing from desired activities   Cough: some throat clearing daytimes, some am cough/congestion slt yellow  Sleep: ok lying flat SABA use: twice a month rec For shortness of breath, cough, wheeze > Dulera 100 Take 2 puffs first thing in am and then another 2 puffs about 12 hours later.  Work on inhaler technique:  Only use your albuterol as a rescue medication    02/18/2020  f/u ov/Narda Fundora re: asthma / large [place]  - last 100% better  May 2021 not on maint dulera Chief Complaint  Patient presents with   Follow-up    Random coughing and wheezing  Dyspnea:  Unless coughing no doe  Cough: white yellow sporadic more day than noct with sensation of pnds Sleeping: bed is still flat/ 3 pillows SABA use: 3 x week it helps some, prednisone works better  02: none  rec Stop dulera  Plan A = Automatic = Always=    Symbicort 80 Take 2 puffs first thing in am and then another 2 puffs about 12 hours later.  Work on Orthoptist B = Backup (to supplement plan A, not to replace  it) Only use your albuterol inhaler as a rescue medication  Protonix 40 mg Take 30  min before your first and last meals of the day  GERD . Please remember to go to the lab department:    Eos 0.3 /  IgE  37 Late Add: Prednisone 10 mg take  4 each am x 2 days,   2 each am x 2 days,  1 each am x 2 days and stop      02/20/2021  acute extended  ov/Leslie Hines re: asthma/ large HH   maint on symbicort 80 /  singulair ppi and h2hs  Chief Complaint  Patient presents with   Acute Visit    Pt c/o wheezing for the past 3 wks. She has also had cough with white sputum. She is using her albuterol inhaler several times per day.   Dyspnea:  MMRC1 = can walk nl pace, flat grade, can't hurry or go uphills or steps s sob   Basement to first floor was easy spring 2022 not now,   never pre rx with saba  Cough: worse for a couple of weeks, no better with prednisone / lots of am congestion thick in am mucoid  Sleeping: Head of bed is not on blocks SABA use: not really helping cough, nor  did prednisone/ not using symbicort as rec =  2 first thing in am  02: none  Covid status:  vax x 4  Rec Plan A = Automatic = Always=    Symbicort 80 Take 2 puffs first thing in am and then another 2 puffs about 12 hours later.  Work on inhaler technique:  Plan B = Backup (to supplement plan A, not to replace it) Only use your albuterol inhaler as a rescue medication  Ok to try albuterol 15 min before an activity (on alternating days)  that you know would usually make you short of breath For cough mucinex dm 1200 mg every 12  hours 6-8 in bed blocks  I will be referring you to Dr Kaylyn Lim about your hernia   11/09/21  NF  Dr Hassell Done   06/01/2022  f/u ov/Leslie Hines re: asthma  "best ever" following NF >>>   maint on singulair/ symbicort 80  2  in am / occ pepcid prn  Chief Complaint  Patient presents with   Follow-up    Discuss medications.  Occasional cough.  Dyspnea:  5 k on treadmill x one hour s stopping or really any limiting  doe at all  Cough: minimal in am non productive  Sleeping: flat bed/ pillows  SABA use: rarely need  02: none      No obvious day to day or daytime variability or assoc excess/ purulent sputum or mucus plugs or hemoptysis or cp or chest tightness, subjective wheeze or overt sinus or hb symptoms.   Sleeping  without nocturnal  or early am exacerbation  of respiratory  c/o's or need for noct saba. Also denies any obvious fluctuation of symptoms with weather or environmental changes or other aggravating or alleviating factors except as outlined above   No unusual exposure hx or h/o childhood pna/ asthma or knowledge of premature birth.  Current Allergies, Complete Past Medical History, Past Surgical History, Family History, and Social History were reviewed in [employer] record.  ROS  The following are not active complaints unless bolded Hoarseness, sore throat, dysphagia, dental problems, itching, sneezing,  nasal congestion or discharge of excess mucus or purulent secretions, ear ache,   fever, chills, sweats, unintended wt loss or wt gain, classically pleuritic or exertional cp,  orthopnea pnd or arm/hand swelling  or leg swelling, presyncope, palpitations, abdominal pain, anorexia, nausea, vomiting, diarrhea  or change in bowel habits or change in bladder habits, change in stools or change in urine, dysuria, hematuria,  rash, arthralgias, visual complaints, headache, numbness, weakness or ataxia or problems with walking or coordination,  change in mood or  memory.        Current Meds  Medication Sig   albuterol (PROVENTIL HFA;VENTOLIN HFA) 108 (90 Base) MCG/ACT inhaler Inhale 1-2 puffs into the lungs every 6 (six) hours as needed for wheezing or shortness of breath.    ALPRAZolam (XANAX) 0.5 MG tablet Take 0.5 mg by mouth daily as needed for anxiety.    budesonide-formoterol (SYMBICORT) 80-4.5 MCG/ACT inhaler TAKE 2 PUFFS FIRST THING IN AM AND THEN ANOTHER 2 PUFFS  ABOUT 12 HOURS LATER.   estradiol (ESTRACE) 2 MG tablet Take 2 mg by mouth daily.   famotidine (PEPCID) 20 MG tablet TAKE 1 TAB BY MOUTH AFTER BREAKFAST & 1 AFTER DINNER (Patient taking differently: Take 20 mg by mouth daily as needed for heartburn or indigestion. TAKE 1 TAB BY MOUTH AFTER BREAKFAST & 1 AFTER DINNER)   hydrochlorothiazide (HYDRODIURIL) 25  MG tablet Take 1 tablet (25 mg total) by mouth daily. NEED OV for further refills. (Patient taking differently: Take 12.5 mg by mouth daily.)   levothyroxine (SYNTHROID) 100 MCG tablet Take 100 mcg by mouth daily before breakfast.   montelukast (SINGULAIR) 10 MG tablet TAKE 1 TABLET BY MOUTH EVERY DAY   ondansetron (ZOFRAN-ODT) 4 MG disintegrating tablet Take 1 tablet (4 mg total) by mouth every 6 (six) hours as needed for nausea.   POTASSIUM PO Take 1 tablet by mouth daily as needed (for leg cramps).    pravastatin (PRAVACHOL) 40 MG tablet Take 40 mg by mouth at bedtime.   zolpidem (AMBIEN) 10 MG tablet Take 10 mg by mouth at bedtime.              Past Medical History:  Diagnosis Date   Anxiety    Arthritis    lt shoulder   Asthma    allergy related   Breast mass, left    Environmental allergies    Hypertension    Hypothyroidism       Objective:    Wts  06/01/2022          150 02/20/2021      175  06/09/2020        179  03/31/2020        172   02/18/20 172 lb 9.6 oz (78.3 kg)  12/11/19 175 lb 1.9 oz (79.4 kg)  12/06/19 170 lb (77.1 kg)    Vital signs reviewed  06/01/2022  - Note at rest 02 sats  98% on RA   General appearance:    amb pleasant wf nad      HEENT : Oropharynx  clear      NECK :  without  apparent JVD/ palpable Nodes/TM    LUNGS: no acc muscle use,  Nl contour chest which is clear to A and P bilaterally without cough on insp or exp maneuvers   CV:  RRR  no s3 or murmur or increase in P2, and no edema   ABD:  soft and nontender   MS:  Nl gait/ ext warm without deformities Or obvious joint  restrictions  calf tenderness, cyanosis or clubbing    SKIN: warm and dry without lesions    NEURO:  alert, approp, nl sensorium with  no motor or cerebellar deficits apparent.         Assessment

## 2022-06-01 ENCOUNTER — Ambulatory Visit: Payer: Medicare HMO | Admitting: Internal Medicine

## 2022-06-01 ENCOUNTER — Encounter: Payer: Self-pay | Admitting: Internal Medicine

## 2022-06-01 VITALS — BP 116/76 | HR 94 | Temp 97.7°F | Ht 64.5 in | Wt 150.2 lb

## 2022-06-01 DIAGNOSIS — J45991 Cough variant asthma: Secondary | ICD-10-CM

## 2022-06-01 MED ORDER — FAMOTIDINE 20 MG PO TABS: ORAL_TABLET | ORAL | 11 refills | Status: AC

## 2022-06-01 NOTE — Assessment & Plan Note (Addendum)
Onset in 1970s and pos response to allergy rx x 5 y with Dr Bernita Buffy - 09/08/2018  After extensive coaching inhaler device,  effectiveness = 75% so try dulera 100 2bid prn flare / maint singulair  - Allergy profile 02/18/2020 >  Eos 0.3 /  IgE  37 -06/09/2020  After extensive coaching inhaler device,  effectiveness =    80% > continue symb 80 2bid and wean off ppi in favor of pepcid  11/06/2020 Cough returned off PPI, she has since resumed Protonix 20-'40mg'$  and Pepcid '20mg'$  and cough is well controlled  - 02/20/2021  After extensive coaching inhaler device,  effectiveness =    75% from a baseline of 50% > continue symbicort 80 2bid  - 06/01/2022  After extensive coaching inhaler device,  effectiveness =    80% > ok to use symb up to 2 bid   All goals of chronic asthma control met including optimal function and elimination of symptoms with minimal need for rescue therapy.  Contingencies discussed in full including contacting this office immediately if not controlling the symptoms using the rule of two's.     Rec continue symb 80 up to 2 bid Based on two studies from NEJM  378; 20 p 1865 (2018) and 380 : p2020-30 (2019) in pts with mild asthma it is reasonable to use low dose symbicort eg 80 2bid "prn" flare in this setting but I emphasized this was only shown with symbicort and takes advantage of the rapid onset of action but is not the same as "rescue therapy" but can be stopped once the acute symptoms have resolved and the need for rescue has been minimized (< 2 x weekly)    Pulmonary f/u can be prn          Each maintenance medication was reviewed in detail including emphasizing most importantly the difference between maintenance and prns and under what circumstances the prns are to be triggered using an action plan format where appropriate.  Total time for H and P, chart review, counseling, reviewing hfa  device(s) and generating customized AVS unique to this office visit / same day charting =  28 min

## 2022-06-01 NOTE — Patient Instructions (Signed)
Symbicort 80 use up to 2 puffs every 12 hours  - if you have a flare go with the full dose and after a week taper back down   Pepcid (famotidine) 20 mg up to twice daily    In the event of a flare of the cough > add prilosec (omeprazole) 20 mg Take 30- 60 min before your first and last meals of the day until     If you are satisfied with your treatment plan,  let your doctor know and he/she can either refill your medications or you can return here when your prescription runs out.     If in any way you are not 100% satisfied,  please tell us.  If 100% better, tell your friends!  Pulmonary follow up is as needed

## 2022-06-16 IMAGING — RF DG UGI W/ HIGH DENSITY W/O KUB
12 of 18 series · 14 of 24 positions shown · non-contrast
Comparison: NONE.

CLINICAL DATA: Cough, hiatal hernia, occasional sensation of meat
and pasta getting stuck in distal esophagus

EXAM:
UPPER GI SERIES WITH HIGH DENSITY WITHOUT KUB
TECHNIQUE: Scout radiograph was obtained. Combined double and single contrast
examination was performed using effervescent crystals, high-density
barium and thin liquid barium. Additionally, 1/2 inch barium tablet
was used in evaluation. This exam was performed by Amazigh, Quirijn, and was supervised and interpreted by Xinaris, Ioustinos.
FLUOROSCOPY TIME:  Radiation Exposure Index (as provided by the
fluoroscopic device): [id number]

[Series 1: one shot · 0.14mm/px · 1 of 1 slices shown]
[im 1/1]
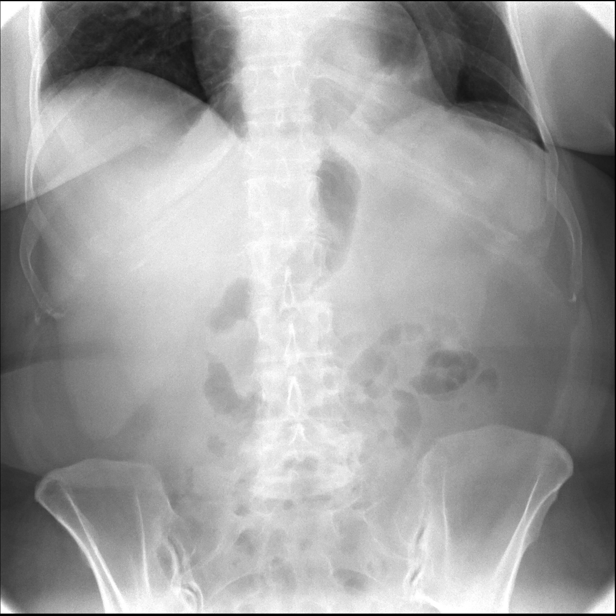

[Series 2: sequence · 0.32mm/px · 1 of 4 frames shown (1 of 11)]
[frame 4/4]
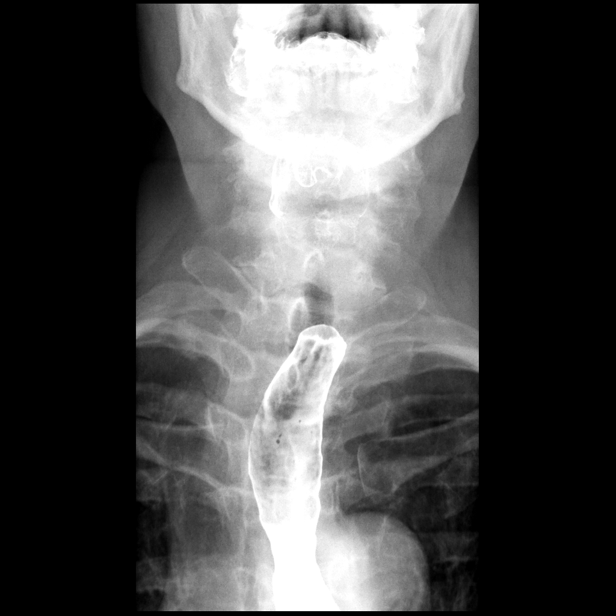

[Series 5: sequence · 0.32mm/px · 1 of 1 slices shown (2 of 11)]
[im 1/1]
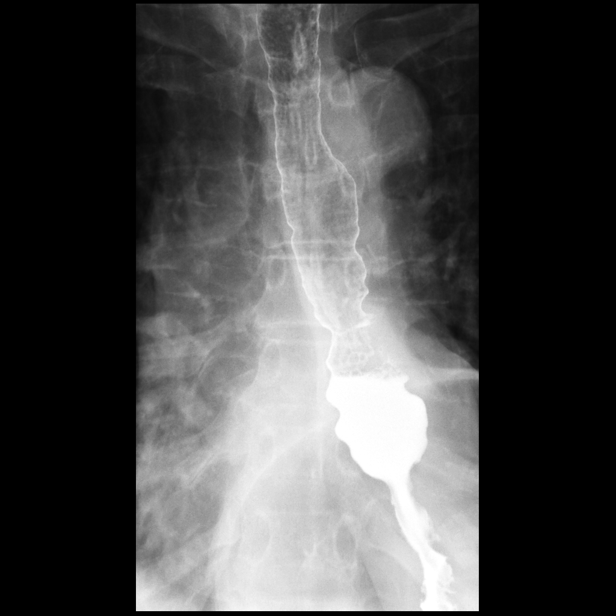

[Series 9: sequence · 0.32mm/px · 2 of 4 frames shown (3 of 11)]
[frame 1/4]
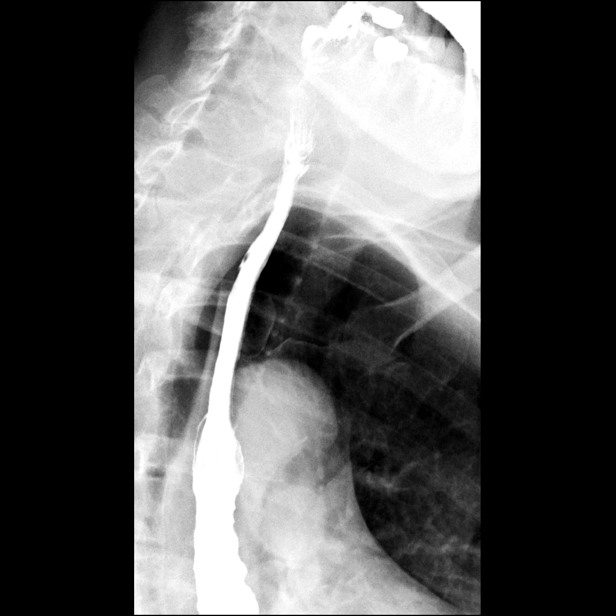
[frame 3/4]
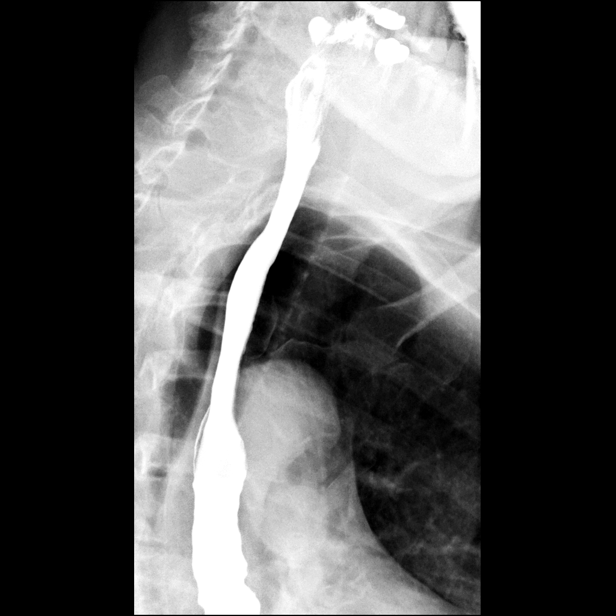

[Series 10: sequence · 0.32mm/px · 1 of 3 frames shown (4 of 11)]
[frame 2/3]
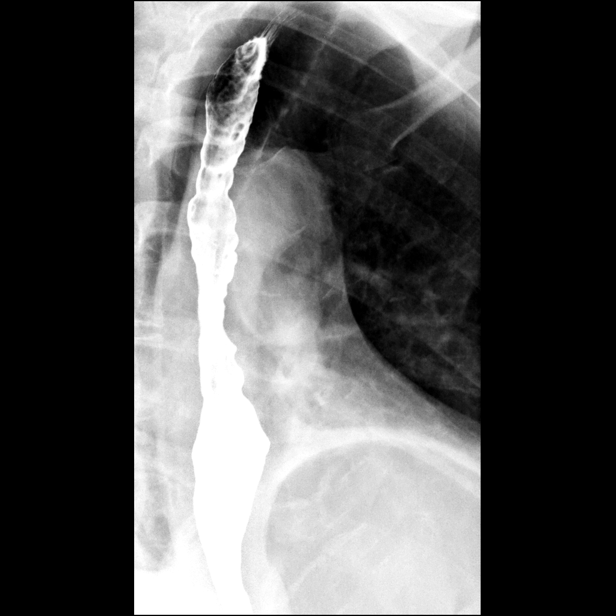

[Series 12: sequence · 0.32mm/px · 2 of 2 frames shown (5 of 11)]
[frame 1/2]
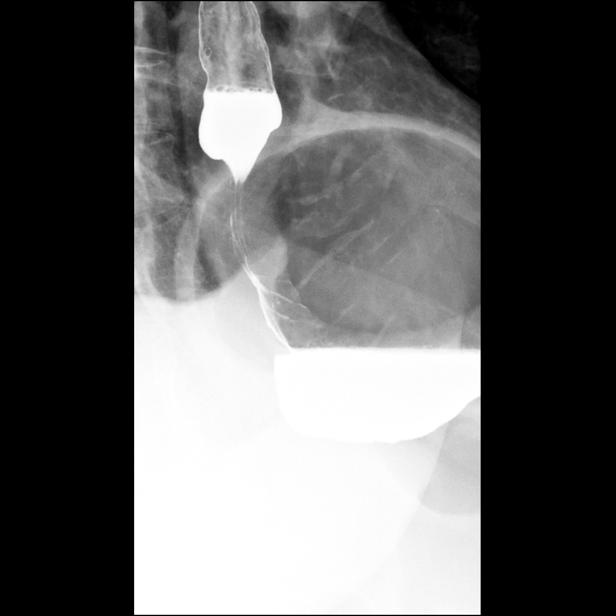
[frame 2/2]
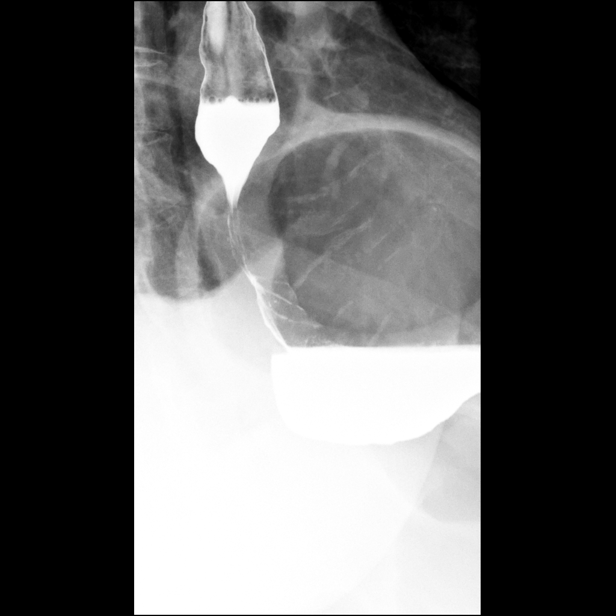

[Series 14: sequence · 0.32mm/px · 1 of 1 slices shown (6 of 11)]
[im 1/1]
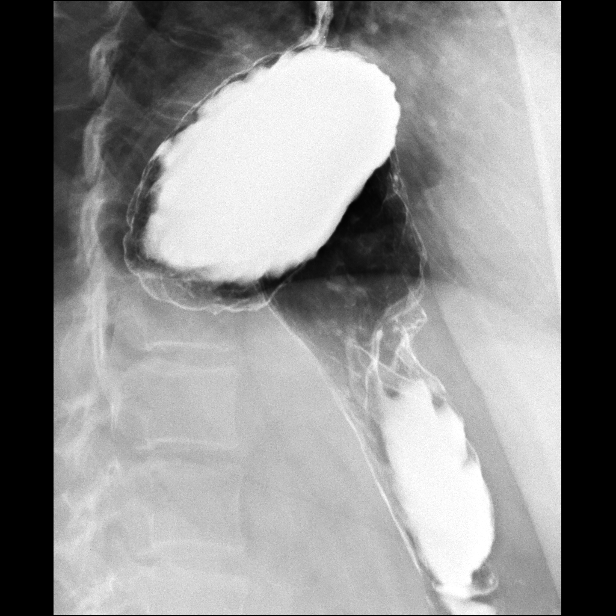

[Series 17: sequence · 0.32mm/px · 1 of 1 slices shown (7 of 11)]
[im 1/1]
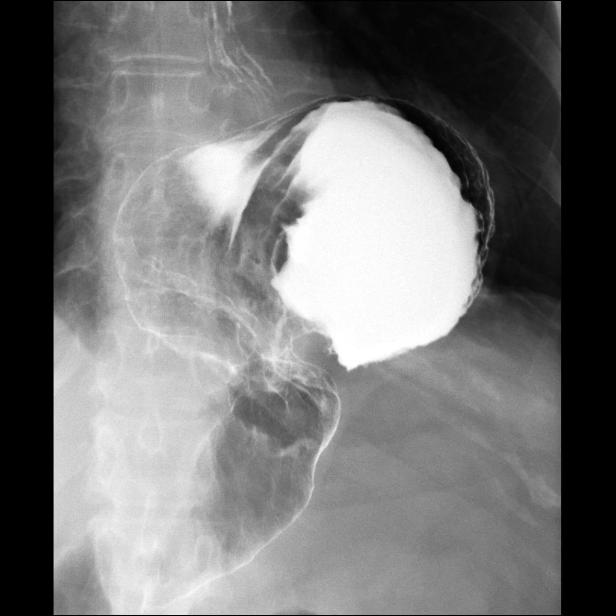

[Series 20: sequence · 0.32mm/px · 1 of 1 slices shown (8 of 11)]
[im 1/1]
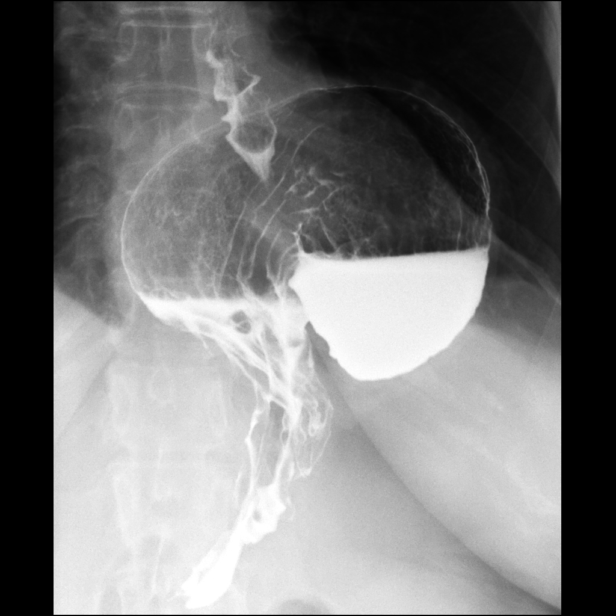

[Series 22: sequence · 0.32mm/px · 1 of 1 slices shown (9 of 11)]
[im 1/1]
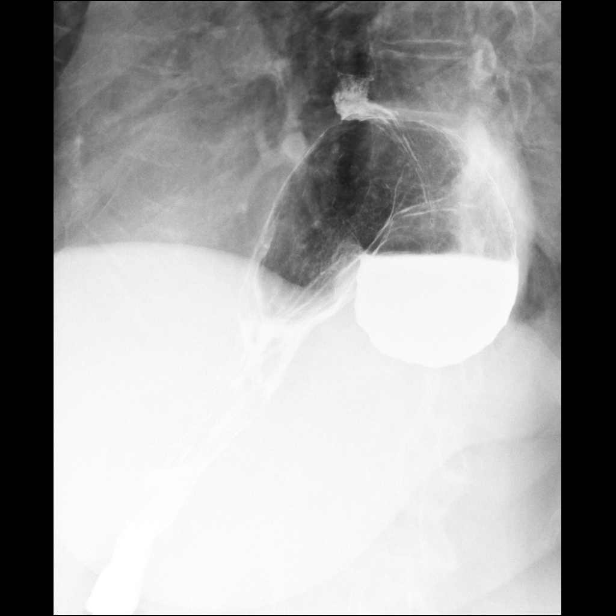

[Series 23: sequence · 1 of 3 frames shown (10 of 11)]
[frame 3/3]
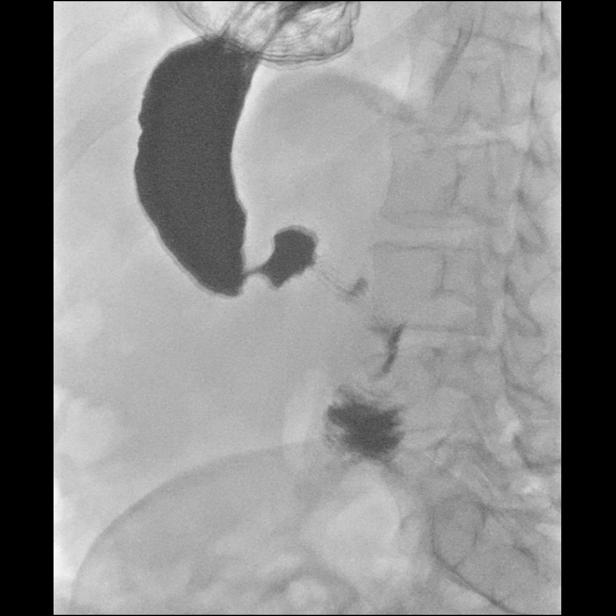

[Series 24: sequence · 1 of 44 frames shown (11 of 11)]
[frame 38/44]
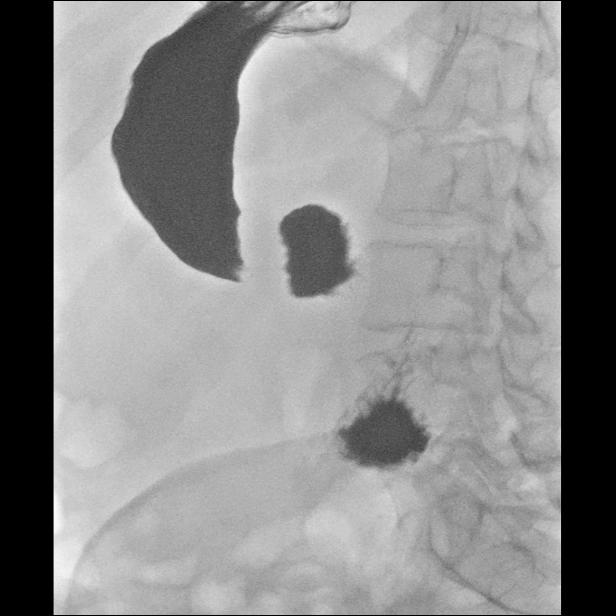

[14 of 24 positions shown; findings below may reference images not displayed]

FINDINGS: Scout Radiograph: Nonobstructive bowel gas pattern. Hiatal hernia
present. Visualized lung bases are clear.

Esophagus: Normal appearance.

Esophageal motility: Tertiary contractions present.

Gastroesophageal reflux: None elicited.

Ingested 13mm barium tablet: Passed normally.

Stomach: No mucosal lesions identified. Approximately [DATE] of the
stomach is situated within the thoracic cavity. No gastric fold
thickening.

Gastric emptying: Normal.

Duodenum: Normal appearance.

Other:  None.
IMPRESSION: 1. Large hiatal hernia encompassing approximately [DATE] of the
stomach.
2. Tertiary contractions.

## 2022-06-22 ENCOUNTER — Other Ambulatory Visit: Payer: Self-pay | Admitting: Family Medicine

## 2022-06-22 DIAGNOSIS — Z1231 Encounter for screening mammogram for malignant neoplasm of breast: Secondary | ICD-10-CM

## 2022-07-06 DIAGNOSIS — M79672 Pain in left foot: Secondary | ICD-10-CM | POA: Diagnosis not present

## 2022-07-06 DIAGNOSIS — M19072 Primary osteoarthritis, left ankle and foot: Secondary | ICD-10-CM | POA: Diagnosis not present

## 2022-07-23 ENCOUNTER — Other Ambulatory Visit: Payer: Self-pay | Admitting: Internal Medicine

## 2022-07-25 DIAGNOSIS — R69 Illness, unspecified: Secondary | ICD-10-CM | POA: Diagnosis not present

## 2022-08-04 ENCOUNTER — Ambulatory Visit
Admission: RE | Admit: 2022-08-04 | Discharge: 2022-08-04 | Disposition: A | Discharge status: Home / Self Care | Payer: Medicare HMO | Source: Ambulatory Visit | Attending: Family Medicine | Admitting: Family Medicine

## 2022-08-04 DIAGNOSIS — Z1231 Encounter for screening mammogram for malignant neoplasm of breast: Secondary | ICD-10-CM

## 2022-10-05 ENCOUNTER — Other Ambulatory Visit: Payer: Self-pay | Admitting: Internal Medicine

## 2022-10-05 DIAGNOSIS — L570 Actinic keratosis: Secondary | ICD-10-CM | POA: Diagnosis not present

## 2022-10-05 DIAGNOSIS — J45991 Cough variant asthma: Secondary | ICD-10-CM

## 2022-10-07 MED ORDER — BUDESONIDE-FORMOTEROL FUMARATE 80-4.5 MCG/ACT IN AERO: INHALATION_SPRAY | RESPIRATORY_TRACT | 3 refills | Status: DC

## 2023-03-01 ENCOUNTER — Other Ambulatory Visit: Payer: Self-pay | Admitting: Internal Medicine

## 2023-03-01 DIAGNOSIS — J45991 Cough variant asthma: Secondary | ICD-10-CM

## 2023-03-15 DIAGNOSIS — H5203 Hypermetropia, bilateral: Secondary | ICD-10-CM | POA: Diagnosis not present

## 2023-03-17 DIAGNOSIS — L814 Other melanin hyperpigmentation: Secondary | ICD-10-CM | POA: Diagnosis not present

## 2023-03-17 DIAGNOSIS — D225 Melanocytic nevi of trunk: Secondary | ICD-10-CM | POA: Diagnosis not present

## 2023-03-17 DIAGNOSIS — Z7189 Other specified counseling: Secondary | ICD-10-CM | POA: Diagnosis not present

## 2023-03-17 DIAGNOSIS — L821 Other seborrheic keratosis: Secondary | ICD-10-CM | POA: Diagnosis not present

## 2023-03-17 DIAGNOSIS — L244 Irritant contact dermatitis due to drugs in contact with skin: Secondary | ICD-10-CM | POA: Diagnosis not present

## 2023-03-17 DIAGNOSIS — L570 Actinic keratosis: Secondary | ICD-10-CM | POA: Diagnosis not present

## 2023-04-15 DIAGNOSIS — E039 Hypothyroidism, unspecified: Secondary | ICD-10-CM | POA: Diagnosis not present

## 2023-04-15 DIAGNOSIS — R7301 Impaired fasting glucose: Secondary | ICD-10-CM | POA: Diagnosis not present

## 2023-04-15 DIAGNOSIS — E7800 Pure hypercholesterolemia, unspecified: Secondary | ICD-10-CM | POA: Diagnosis not present

## 2023-04-15 DIAGNOSIS — K449 Diaphragmatic hernia without obstruction or gangrene: Secondary | ICD-10-CM | POA: Diagnosis not present

## 2023-04-15 DIAGNOSIS — I10 Essential (primary) hypertension: Secondary | ICD-10-CM | POA: Diagnosis not present

## 2023-05-01 ENCOUNTER — Other Ambulatory Visit: Payer: Self-pay | Admitting: Internal Medicine

## 2023-07-13 DIAGNOSIS — E039 Hypothyroidism, unspecified: Secondary | ICD-10-CM | POA: Diagnosis not present

## 2023-07-15 DIAGNOSIS — T63481A Toxic effect of venom of other arthropod, accidental (unintentional), initial encounter: Secondary | ICD-10-CM | POA: Diagnosis not present

## 2023-09-24 DIAGNOSIS — E7800 Pure hypercholesterolemia, unspecified: Secondary | ICD-10-CM | POA: Diagnosis not present

## 2023-09-24 DIAGNOSIS — J45909 Unspecified asthma, uncomplicated: Secondary | ICD-10-CM | POA: Diagnosis not present

## 2023-09-24 DIAGNOSIS — E038 Other specified hypothyroidism: Secondary | ICD-10-CM | POA: Diagnosis not present

## 2023-09-24 DIAGNOSIS — I10 Essential (primary) hypertension: Secondary | ICD-10-CM | POA: Diagnosis not present

## 2023-10-24 DIAGNOSIS — I10 Essential (primary) hypertension: Secondary | ICD-10-CM | POA: Diagnosis not present

## 2023-10-24 DIAGNOSIS — E7800 Pure hypercholesterolemia, unspecified: Secondary | ICD-10-CM | POA: Diagnosis not present

## 2023-10-24 DIAGNOSIS — E038 Other specified hypothyroidism: Secondary | ICD-10-CM | POA: Diagnosis not present

## 2023-10-24 DIAGNOSIS — J45909 Unspecified asthma, uncomplicated: Secondary | ICD-10-CM | POA: Diagnosis not present

## 2023-11-11 ENCOUNTER — Encounter: Payer: Self-pay | Admitting: Advanced Practice Midwife

## 2023-11-24 DIAGNOSIS — J45909 Unspecified asthma, uncomplicated: Secondary | ICD-10-CM | POA: Diagnosis not present

## 2023-11-24 DIAGNOSIS — E038 Other specified hypothyroidism: Secondary | ICD-10-CM | POA: Diagnosis not present

## 2023-11-24 DIAGNOSIS — E7800 Pure hypercholesterolemia, unspecified: Secondary | ICD-10-CM | POA: Diagnosis not present

## 2023-11-24 DIAGNOSIS — I10 Essential (primary) hypertension: Secondary | ICD-10-CM | POA: Diagnosis not present

## 2023-12-25 DIAGNOSIS — E7800 Pure hypercholesterolemia, unspecified: Secondary | ICD-10-CM | POA: Diagnosis not present

## 2023-12-25 DIAGNOSIS — E038 Other specified hypothyroidism: Secondary | ICD-10-CM | POA: Diagnosis not present

## 2023-12-25 DIAGNOSIS — J45909 Unspecified asthma, uncomplicated: Secondary | ICD-10-CM | POA: Diagnosis not present

## 2023-12-25 DIAGNOSIS — I10 Essential (primary) hypertension: Secondary | ICD-10-CM | POA: Diagnosis not present

## 2024-01-13 ENCOUNTER — Other Ambulatory Visit: Payer: Self-pay | Admitting: Family Medicine

## 2024-01-13 DIAGNOSIS — Z1231 Encounter for screening mammogram for malignant neoplasm of breast: Secondary | ICD-10-CM

## 2024-01-13 DIAGNOSIS — Z0000 Encounter for general adult medical examination without abnormal findings: Secondary | ICD-10-CM

## 2024-01-24 ENCOUNTER — Ambulatory Visit
Admission: RE | Admit: 2024-01-24 | Discharge: 2024-01-24 | Disposition: A | Discharge status: Home / Self Care | Source: Ambulatory Visit | Attending: Family Medicine | Admitting: Family Medicine

## 2024-01-24 DIAGNOSIS — J45909 Unspecified asthma, uncomplicated: Secondary | ICD-10-CM | POA: Diagnosis not present

## 2024-01-24 DIAGNOSIS — Z1231 Encounter for screening mammogram for malignant neoplasm of breast: Secondary | ICD-10-CM | POA: Diagnosis not present

## 2024-01-24 DIAGNOSIS — I10 Essential (primary) hypertension: Secondary | ICD-10-CM | POA: Diagnosis not present

## 2024-01-24 DIAGNOSIS — E7800 Pure hypercholesterolemia, unspecified: Secondary | ICD-10-CM | POA: Diagnosis not present

## 2024-01-24 DIAGNOSIS — E038 Other specified hypothyroidism: Secondary | ICD-10-CM | POA: Diagnosis not present

## 2024-02-23 DIAGNOSIS — Z1211 Encounter for screening for malignant neoplasm of colon: Secondary | ICD-10-CM | POA: Diagnosis not present

## 2024-02-23 DIAGNOSIS — K5730 Diverticulosis of large intestine without perforation or abscess without bleeding: Secondary | ICD-10-CM | POA: Diagnosis not present

## 2024-02-23 DIAGNOSIS — K644 Residual hemorrhoidal skin tags: Secondary | ICD-10-CM | POA: Diagnosis not present

## 2024-02-23 DIAGNOSIS — K641 Second degree hemorrhoids: Secondary | ICD-10-CM | POA: Diagnosis not present

## 2024-02-23 DIAGNOSIS — D175 Benign lipomatous neoplasm of intra-abdominal organs: Secondary | ICD-10-CM | POA: Diagnosis not present

## 2024-02-24 DIAGNOSIS — E038 Other specified hypothyroidism: Secondary | ICD-10-CM | POA: Diagnosis not present

## 2024-02-24 DIAGNOSIS — E7800 Pure hypercholesterolemia, unspecified: Secondary | ICD-10-CM | POA: Diagnosis not present

## 2024-02-24 DIAGNOSIS — I10 Essential (primary) hypertension: Secondary | ICD-10-CM | POA: Diagnosis not present

## 2024-02-24 DIAGNOSIS — J45909 Unspecified asthma, uncomplicated: Secondary | ICD-10-CM | POA: Diagnosis not present

## 2024-03-13 DIAGNOSIS — H5203 Hypermetropia, bilateral: Secondary | ICD-10-CM | POA: Diagnosis not present

## 2024-03-28 DIAGNOSIS — I10 Essential (primary) hypertension: Secondary | ICD-10-CM | POA: Diagnosis not present

## 2024-03-28 DIAGNOSIS — E7800 Pure hypercholesterolemia, unspecified: Secondary | ICD-10-CM | POA: Diagnosis not present

## 2024-03-28 DIAGNOSIS — E038 Other specified hypothyroidism: Secondary | ICD-10-CM | POA: Diagnosis not present
